# Patient Record
Sex: Female | Born: 1960
Health system: Southern US, Community
[De-identification: ages and names within clinical notes are randomized; demographics above are authoritative.]

## PROBLEM LIST (undated history)

## (undated) DIAGNOSIS — C4359 Malignant melanoma of other part of trunk: Secondary | ICD-10-CM

## (undated) DIAGNOSIS — G459 Transient cerebral ischemic attack, unspecified: Secondary | ICD-10-CM

## (undated) DIAGNOSIS — L03115 Cellulitis of right lower limb: Secondary | ICD-10-CM

## (undated) DIAGNOSIS — J45909 Unspecified asthma, uncomplicated: Secondary | ICD-10-CM

## (undated) DIAGNOSIS — Z8719 Personal history of other diseases of the digestive system: Secondary | ICD-10-CM

## (undated) DIAGNOSIS — C539 Malignant neoplasm of cervix uteri, unspecified: Secondary | ICD-10-CM

## (undated) DIAGNOSIS — J449 Chronic obstructive pulmonary disease, unspecified: Secondary | ICD-10-CM

## (undated) HISTORY — PX: GYNECOLOGIC CRYOSURGERY: SHX857

---

## 1988-04-30 HISTORY — PX: TUBAL LIGATION: SHX77

## 1997-12-27 ENCOUNTER — Emergency Department (HOSPITAL_COMMUNITY): Admission: EM | Admit: 1997-12-27 | Discharge: 1997-12-27 | Payer: Self-pay | Admitting: Emergency Medicine

## 2002-04-29 ENCOUNTER — Emergency Department (HOSPITAL_COMMUNITY): Admission: EM | Admit: 2002-04-29 | Discharge: 2002-04-29 | Payer: Self-pay | Admitting: Emergency Medicine

## 2003-05-22 ENCOUNTER — Emergency Department (HOSPITAL_COMMUNITY): Admission: EM | Admit: 2003-05-22 | Discharge: 2003-05-22 | Payer: Self-pay | Admitting: Emergency Medicine

## 2003-09-01 ENCOUNTER — Emergency Department (HOSPITAL_COMMUNITY): Admission: EM | Admit: 2003-09-01 | Discharge: 2003-09-01 | Payer: Self-pay | Admitting: Emergency Medicine

## 2005-06-25 ENCOUNTER — Emergency Department (HOSPITAL_COMMUNITY): Admission: EM | Admit: 2005-06-25 | Discharge: 2005-06-25 | Payer: Self-pay | Admitting: Emergency Medicine

## 2005-06-28 ENCOUNTER — Emergency Department (HOSPITAL_COMMUNITY): Admission: EM | Admit: 2005-06-28 | Discharge: 2005-06-28 | Payer: Self-pay | Admitting: Emergency Medicine

## 2006-09-29 ENCOUNTER — Emergency Department (HOSPITAL_COMMUNITY): Admission: EM | Admit: 2006-09-29 | Discharge: 2006-09-29 | Payer: Self-pay | Admitting: Emergency Medicine

## 2007-02-18 ENCOUNTER — Emergency Department (HOSPITAL_COMMUNITY): Admission: EM | Admit: 2007-02-18 | Discharge: 2007-02-18 | Payer: Self-pay | Admitting: Emergency Medicine

## 2008-08-31 ENCOUNTER — Emergency Department (HOSPITAL_BASED_OUTPATIENT_CLINIC_OR_DEPARTMENT_OTHER): Admission: EM | Admit: 2008-08-31 | Discharge: 2008-08-31 | Payer: Self-pay | Admitting: Emergency Medicine

## 2009-04-30 HISTORY — PX: MELANOMA EXCISION: SHX5266

## 2009-07-02 ENCOUNTER — Emergency Department (HOSPITAL_COMMUNITY): Admission: EM | Admit: 2009-07-02 | Discharge: 2009-07-02 | Payer: Self-pay | Admitting: Emergency Medicine

## 2010-07-31 ENCOUNTER — Emergency Department (HOSPITAL_BASED_OUTPATIENT_CLINIC_OR_DEPARTMENT_OTHER)
Admission: EM | Admit: 2010-07-31 | Discharge: 2010-07-31 | Disposition: A | Discharge status: Home / Self Care | Payer: Self-pay | Attending: Emergency Medicine | Admitting: Emergency Medicine

## 2010-07-31 DIAGNOSIS — N12 Tubulo-interstitial nephritis, not specified as acute or chronic: Secondary | ICD-10-CM | POA: Insufficient documentation

## 2010-07-31 DIAGNOSIS — J45909 Unspecified asthma, uncomplicated: Secondary | ICD-10-CM | POA: Insufficient documentation

## 2010-07-31 DIAGNOSIS — M549 Dorsalgia, unspecified: Secondary | ICD-10-CM | POA: Insufficient documentation

## 2010-07-31 DIAGNOSIS — F17200 Nicotine dependence, unspecified, uncomplicated: Secondary | ICD-10-CM | POA: Insufficient documentation

## 2010-07-31 LAB — URINALYSIS, ROUTINE W REFLEX MICROSCOPIC
Nitrite: NEGATIVE
Specific Gravity, Urine: 1.018 (ref 1.005–1.030)
Urobilinogen, UA: 1 mg/dL (ref 0.0–1.0)

## 2010-07-31 LAB — URINE MICROSCOPIC-ADD ON

## 2010-12-06 ENCOUNTER — Encounter: Payer: Self-pay | Admitting: *Deleted

## 2010-12-06 ENCOUNTER — Emergency Department (HOSPITAL_BASED_OUTPATIENT_CLINIC_OR_DEPARTMENT_OTHER)
Admission: EM | Admit: 2010-12-06 | Discharge: 2010-12-06 | Disposition: A | Discharge status: Home / Self Care | Payer: Self-pay | Attending: Emergency Medicine | Admitting: Emergency Medicine

## 2010-12-06 DIAGNOSIS — L02419 Cutaneous abscess of limb, unspecified: Secondary | ICD-10-CM

## 2010-12-06 DIAGNOSIS — IMO0002 Reserved for concepts with insufficient information to code with codable children: Secondary | ICD-10-CM | POA: Insufficient documentation

## 2010-12-06 MED ORDER — TETANUS-DIPHTH-ACELL PERTUSSIS 5-2.5-18.5 LF-MCG/0.5 IM SUSP
0.5000 mL | Freq: Once | INTRAMUSCULAR | Status: AC
  Administered 2010-12-06: 0.5 mL via INTRAMUSCULAR
  Filled 2010-12-06: qty 0.5

## 2010-12-06 MED ORDER — DOXYCYCLINE HYCLATE 100 MG PO CAPS: 100.0000 mg | ORAL_CAPSULE | Freq: Two times a day (BID) | ORAL | Status: AC

## 2010-12-06 NOTE — ED Provider Notes (Signed)
History     CSN: [id number] Arrival date & time: 12/06/2010  3:42 PM  Chief Complaint  Patient presents with  . Abscess   HPI Comments: Pt states that it is getting a little better, but it is not going away  Patient is a 49 y.o. female presenting with abscess. The history is provided by the patient. No language interpreter was used.  Abscess  This is a new problem. The current episode started less than one week ago. The problem has been gradually improving. Affected Location: left elbow. The problem is mild. The abscess is characterized by redness and painfulness. The abscess first occurred at home. Pertinent negatives include no fever, no diarrhea, no vomiting, no sore throat and no cough. Her past medical history is significant for skin abscesses in family. There were sick contacts at home. She has received no recent medical care.    History reviewed. No pertinent past medical history.  Past Surgical History  Procedure Date  . Tubal ligation     History reviewed. No pertinent family history.  History  Substance Use Topics  . Smoking status: Current Everyday Smoker -- 0.5 packs/day  . Smokeless tobacco: Not on file  . Alcohol Use: No    OB History    Grav Para Term Preterm Abortions TAB SAB Ect Mult Living                  Review of Systems  Constitutional: Negative for fever.  HENT: Negative for sore throat.   Respiratory: Negative for cough.   Gastrointestinal: Negative for vomiting and diarrhea.  All other systems reviewed and are negative.    Physical Exam  BP 147/89  Pulse 85  Temp(Src) 98.8 F (37.1 C) (Oral)  Resp 16  Wt 150 lb (68.04 kg)  Physical Exam  Vitals reviewed. Constitutional: She is oriented to person, place, and time. She appears well-developed and well-nourished.  Cardiovascular: Normal rate and regular rhythm.   Musculoskeletal: Normal range of motion. She exhibits tenderness.  Neurological: She is alert and oriented to person, place,  and time.  Skin:       Pt has red tender area to the left elbow:no fluctuance or firmness noted:pt has a scab to the center of the area  Psychiatric: She has a normal mood and affect.    ED Course  Procedures  MDM No need for I&D at this time;will treat with antibiotics:location not consistent with a  bursitis      Teressa Lower, NP 12/06/10 1554  Teressa Lower, NP 12/06/10 1555

## 2010-12-06 NOTE — ED Notes (Signed)
Pt c/o abscess to left elbow x 3 days

## 2010-12-31 NOTE — ED Provider Notes (Signed)
Medical screening examination/treatment/procedure(s) were performed by non-physician practitioner and as supervising physician I was immediately available for consultation/collaboration.   Shelda Jakes, MD 12/31/10 [id number]

## 2011-04-30 ENCOUNTER — Encounter (HOSPITAL_BASED_OUTPATIENT_CLINIC_OR_DEPARTMENT_OTHER): Payer: Self-pay | Admitting: Emergency Medicine

## 2011-04-30 ENCOUNTER — Emergency Department (HOSPITAL_BASED_OUTPATIENT_CLINIC_OR_DEPARTMENT_OTHER)
Admission: EM | Admit: 2011-04-30 | Discharge: 2011-04-30 | Disposition: A | Discharge status: Home / Self Care | Payer: Self-pay | Attending: Emergency Medicine | Admitting: Emergency Medicine

## 2011-04-30 ENCOUNTER — Emergency Department (INDEPENDENT_AMBULATORY_CARE_PROVIDER_SITE_OTHER): Payer: Self-pay

## 2011-04-30 DIAGNOSIS — R05 Cough: Secondary | ICD-10-CM | POA: Insufficient documentation

## 2011-04-30 DIAGNOSIS — R1032 Left lower quadrant pain: Secondary | ICD-10-CM

## 2011-04-30 DIAGNOSIS — R112 Nausea with vomiting, unspecified: Secondary | ICD-10-CM

## 2011-04-30 DIAGNOSIS — F17200 Nicotine dependence, unspecified, uncomplicated: Secondary | ICD-10-CM | POA: Insufficient documentation

## 2011-04-30 DIAGNOSIS — R059 Cough, unspecified: Secondary | ICD-10-CM | POA: Insufficient documentation

## 2011-04-30 DIAGNOSIS — D259 Leiomyoma of uterus, unspecified: Secondary | ICD-10-CM

## 2011-04-30 LAB — CBC
MCV: 89.8 fL (ref 78.0–100.0)
Platelets: 172 10*3/uL (ref 150–400)
RBC: 5.19 MIL/uL — ABNORMAL HIGH (ref 3.87–5.11)
WBC: 5.4 10*3/uL (ref 4.0–10.5)

## 2011-04-30 LAB — URINALYSIS, ROUTINE W REFLEX MICROSCOPIC
Glucose, UA: NEGATIVE mg/dL
Ketones, ur: 15 mg/dL — AB
Leukocytes, UA: NEGATIVE
pH: 5.5 (ref 5.0–8.0)

## 2011-04-30 LAB — COMPREHENSIVE METABOLIC PANEL
ALT: 15 U/L (ref 0–35)
Alkaline Phosphatase: 67 U/L (ref 39–117)
CO2: 20 mEq/L (ref 19–32)
Chloride: 103 mEq/L (ref 96–112)
GFR calc Af Amer: 68 mL/min — ABNORMAL LOW (ref >90)
GFR calc non Af Amer: 58 mL/min — ABNORMAL LOW (ref >90)
Glucose, Bld: 87 mg/dL (ref 70–99)
Potassium: 3.5 mEq/L (ref 3.5–5.1)
Sodium: 137 mEq/L (ref 135–145)
Total Bilirubin: 0.4 mg/dL (ref 0.3–1.2)

## 2011-04-30 LAB — DIFFERENTIAL
Eosinophils Relative: 0 % (ref 0–5)
Lymphocytes Relative: 30 % (ref 12–46)
Lymphs Abs: 1.6 10*3/uL (ref 0.7–4.0)
Neutro Abs: 2.9 10*3/uL (ref 1.7–7.7)

## 2011-04-30 MED ORDER — PROMETHAZINE HCL 25 MG PO TABS: 25.0000 mg | ORAL_TABLET | Freq: Four times a day (QID) | ORAL | Status: AC | PRN

## 2011-04-30 MED ORDER — IOHEXOL 300 MG/ML  SOLN
18.0000 mL | INTRAMUSCULAR | Status: DC | PRN
  Administered 2011-04-30: 18 mL via ORAL

## 2011-04-30 MED ORDER — HYDROMORPHONE HCL PF 1 MG/ML IJ SOLN
1.0000 mg | Freq: Once | INTRAMUSCULAR | Status: AC
  Administered 2011-04-30: 1 mg via INTRAVENOUS
  Filled 2011-04-30: qty 1

## 2011-04-30 MED ORDER — IOHEXOL 300 MG/ML  SOLN
100.0000 mL | Freq: Once | INTRAMUSCULAR | Status: AC | PRN
  Administered 2011-04-30: 100 mL via INTRAVENOUS

## 2011-04-30 MED ORDER — SODIUM CHLORIDE 0.9 % IV SOLN
Freq: Once | INTRAVENOUS | Status: AC
  Administered 2011-04-30: 19:00:00 via INTRAVENOUS

## 2011-04-30 MED ORDER — ONDANSETRON HCL 4 MG/2ML IJ SOLN
4.0000 mg | Freq: Once | INTRAMUSCULAR | Status: AC
Start: 2011-04-30 — End: 2011-04-30
  Administered 2011-04-30: 4 mg via INTRAVENOUS
  Filled 2011-04-30: qty 2

## 2011-04-30 MED ORDER — HYDROCODONE-ACETAMINOPHEN 5-325 MG PO TABS: 2.0000 | ORAL_TABLET | ORAL | Status: AC | PRN

## 2011-04-30 NOTE — ED Notes (Signed)
Pt reports L flank pain with radiation to L LQ

## 2011-04-30 NOTE — ED Provider Notes (Signed)
History     CSN: [id number]  Arrival date & time 04/30/11  1652   None     Chief Complaint  Patient presents with  . Abdominal Pain    (Consider location/radiation/quality/duration/timing/severity/associated sxs/prior treatment) Patient is a 50 y.o. female presenting with abdominal pain. The history is provided by the patient. No language interpreter was used.  Abdominal Pain The primary symptoms of the illness include abdominal pain. The current episode started more than 2 days ago. The onset of the illness was gradual. The problem has been gradually worsening.  Associated with: coughing. The patient states that she believes she is currently not pregnant. The patient has not had a change in bowel habit. Additional symptoms associated with the illness include back pain. Significant associated medical issues do not include PUD or diverticulitis.    Past Medical History  Diagnosis Date  . Melanoma   . Hernia     Past Surgical History  Procedure Date  . Tubal ligation     No family history on file.  History  Substance Use Topics  . Smoking status: Current Everyday Smoker -- 0.5 packs/day  . Smokeless tobacco: Not on file  . Alcohol Use: No    OB History    Grav Para Term Preterm Abortions TAB SAB Ect Mult Living                  Review of Systems  Gastrointestinal: Positive for abdominal pain.  Musculoskeletal: Positive for back pain.  All other systems reviewed and are negative.    Allergies  Asa buff (mag; Chocolate; and Compazine  Home Medications   Current Outpatient Rx  Name Route Sig Dispense Refill  . IBUPROFEN 200 MG PO TABS Oral Take 800 mg by mouth every 6 (six) hours as needed. For pain     . PSEUDOEPH-DOXYLAMINE-DM-APAP 30-6.25-15-325 MG PO CAPS Oral Take 2 capsules by mouth at bedtime.        BP 116/80  Pulse 68  Temp(Src) 97.8 F (36.6 C) (Oral)  Resp 20  SpO2 99%  Physical Exam  Nursing note and vitals reviewed. Constitutional:  She is oriented to person, place, and time. She appears well-developed and well-nourished.  HENT:  Head: Normocephalic and atraumatic.  Right Ear: External ear normal.  Left Ear: External ear normal.  Nose: Nose normal.  Mouth/Throat: Oropharynx is clear and moist.  Eyes: Conjunctivae and EOM are normal. Pupils are equal, round, and reactive to light.  Neck: Normal range of motion. Neck supple.  Cardiovascular: Normal rate and normal heart sounds.   Pulmonary/Chest: Effort normal and breath sounds normal.  Abdominal: Soft. There is tenderness. There is guarding.  Musculoskeletal: Normal range of motion.  Neurological: She is alert and oriented to person, place, and time.  Skin: Skin is warm.  Psychiatric: She has a normal mood and affect.    ED Course  Procedures (including critical care time)   Labs Reviewed  URINALYSIS, ROUTINE W REFLEX MICROSCOPIC   No results found.   No diagnosis found.    MDM   Results for orders placed during the hospital encounter of 04/30/11  URINALYSIS, ROUTINE W REFLEX MICROSCOPIC      Component Value Range   Color, Urine AMBER (*) YELLOW    APPearance CLOUDY (*) CLEAR    Specific Gravity, Urine 1.027  1.005 - 1.030    pH 5.5  5.0 - 8.0    Glucose, UA NEGATIVE  NEGATIVE (mg/dL)   Hgb urine dipstick NEGATIVE  NEGATIVE  Bilirubin Urine SMALL (*) NEGATIVE    Ketones, ur 15 (*) NEGATIVE (mg/dL)   Protein, ur 30 (*) NEGATIVE (mg/dL)   Urobilinogen, UA 1.0  0.0 - 1.0 (mg/dL)   Nitrite NEGATIVE  NEGATIVE    Leukocytes, UA NEGATIVE  NEGATIVE   URINE MICROSCOPIC-ADD ON      Component Value Range   Squamous Epithelial / LPF FEW (*) RARE    Bacteria, UA RARE  RARE   CBC      Component Value Range   WBC 5.4  4.0 - 10.5 (K/uL)   RBC 5.19 (*) 3.87 - 5.11 (MIL/uL)   Hemoglobin 16.4 (*) 12.0 - 15.0 (g/dL)   HCT 16.1 (*) 09.6 - 46.0 (%)   MCV 89.8  78.0 - 100.0 (fL)   MCH 31.6  26.0 - 34.0 (pg)   MCHC 35.2  30.0 - 36.0 (g/dL)   RDW 04.5  40.9  - 81.1 (%)   Platelets 172  150 - 400 (K/uL)  DIFFERENTIAL      Component Value Range   Neutrophils Relative 54  43 - 77 (%)   Neutro Abs 2.9  1.7 - 7.7 (K/uL)   Lymphocytes Relative 30  12 - 46 (%)   Lymphs Abs 1.6  0.7 - 4.0 (K/uL)   Monocytes Relative 16 (*) 3 - 12 (%)   Monocytes Absolute 0.8  0.1 - 1.0 (K/uL)   Eosinophils Relative 0  0 - 5 (%)   Eosinophils Absolute 0.0  0.0 - 0.7 (K/uL)   Basophils Relative 0  0 - 1 (%)   Basophils Absolute 0.0  0.0 - 0.1 (K/uL)  COMPREHENSIVE METABOLIC PANEL      Component Value Range   Sodium 137  135 - 145 (mEq/L)   Potassium 3.5  3.5 - 5.1 (mEq/L)   Chloride 103  96 - 112 (mEq/L)   CO2 20  19 - 32 (mEq/L)   Glucose, Bld 87  70 - 99 (mg/dL)   BUN 18  6 - 23 (mg/dL)   Creatinine, Ser 9.14  0.50 - 1.10 (mg/dL)   Calcium 8.8  8.4 - 78.2 (mg/dL)   Total Protein 7.0  6.0 - 8.3 (g/dL)   Albumin 3.5  3.5 - 5.2 (g/dL)   AST 23  0 - 37 (U/L)   ALT 15  0 - 35 (U/L)   Alkaline Phosphatase 67  39 - 117 (U/L)   Total Bilirubin 0.4  0.3 - 1.2 (mg/dL)   GFR calc non Af Amer 58 (*) >90 (mL/min)   GFR calc Af Amer 68 (*) >90 (mL/min)  LIPASE, BLOOD      Component Value Range   Lipase 28  11 - 59 (U/L)   Ct Abdomen Pelvis W Contrast  04/30/2011  *RADIOLOGY REPORT*  Clinical Data: Left lower quadrant pain.  Nausea and vomiting.  CT ABDOMEN AND PELVIS WITH CONTRAST  Technique:  Multidetector CT imaging of the abdomen and pelvis was performed following the standard protocol during bolus administration of intravenous contrast.  Contrast: 18mL OMNIPAQUE IOHEXOL 300 MG/ML IV SOLN, OMNIPAQUE IOHEXOL 300 MG/ML IV SOLN  Comparison: None.  Findings: The liver, spleen, pancreas, adrenal glands, and kidneys are normal.  Gallbladder and bile ducts are normal. The bowel appears normal including the terminal ileum and appendix.  There is no diverticulosis or diverticulitis.  There are at least two fibroids in the uterus the largest being to the left of midline  measuring 3.1 cm in diameter.  Ovaries are not identified.  No acute osseous abnormality.  Mild arthritic changes at both hips.  IMPRESSION: No acute abnormality of the abdomen or pelvis.  Original Report Authenticated By: Gwynn Burly, M.D.   Pt given Iv fluids, nausea and pain medication.   Ct scan is normal.  I will treat cough and pain.  I will given Vicodin that should help with cough and with pain.  Pt given rx for phenergan        Langston Masker, [place] 04/30/11 2039

## 2011-04-30 NOTE — ED Notes (Signed)
Pt urinated on herself on stretcher. EMT at bedside assisting pt with clean linens and clean gown. Pt sts she has "problems controlling her bladder when she coughs".

## 2011-04-30 NOTE — ED Notes (Signed)
PA at bedside.

## 2011-04-30 NOTE — ED Notes (Signed)
Patient is resting comfortably.returned from CT scan no distress noted

## 2011-04-30 NOTE — ED Notes (Signed)
Pt c/o abd pain (RUQ & RLQ) x 3 days; sts she thinks "it has something to do w/ my coughing"

## 2011-05-01 NOTE — ED Provider Notes (Signed)
History/physical exam/procedure(s) were performed by non-physician practitioner and as supervising physician I was immediately available for consultation/collaboration. I have reviewed all notes and am in agreement with care and plan.   Emilyanne Mcgough S Imari Reen, MD 05/01/11 0720 

## 2011-11-06 ENCOUNTER — Emergency Department (HOSPITAL_BASED_OUTPATIENT_CLINIC_OR_DEPARTMENT_OTHER)
Admission: EM | Admit: 2011-11-06 | Discharge: 2011-11-06 | Disposition: A | Discharge status: Home / Self Care | Payer: Self-pay | Attending: Emergency Medicine | Admitting: Emergency Medicine

## 2011-11-06 ENCOUNTER — Encounter (HOSPITAL_BASED_OUTPATIENT_CLINIC_OR_DEPARTMENT_OTHER): Payer: Self-pay | Admitting: Emergency Medicine

## 2011-11-06 DIAGNOSIS — S0500XA Injury of conjunctiva and corneal abrasion without foreign body, unspecified eye, initial encounter: Secondary | ICD-10-CM

## 2011-11-06 DIAGNOSIS — Z91018 Allergy to other foods: Secondary | ICD-10-CM | POA: Insufficient documentation

## 2011-11-06 DIAGNOSIS — Z888 Allergy status to other drugs, medicaments and biological substances status: Secondary | ICD-10-CM | POA: Insufficient documentation

## 2011-11-06 DIAGNOSIS — F17200 Nicotine dependence, unspecified, uncomplicated: Secondary | ICD-10-CM | POA: Insufficient documentation

## 2011-11-06 DIAGNOSIS — S058X9A Other injuries of unspecified eye and orbit, initial encounter: Secondary | ICD-10-CM | POA: Insufficient documentation

## 2011-11-06 DIAGNOSIS — Z85820 Personal history of malignant melanoma of skin: Secondary | ICD-10-CM | POA: Insufficient documentation

## 2011-11-06 DIAGNOSIS — IMO0002 Reserved for concepts with insufficient information to code with codable children: Secondary | ICD-10-CM | POA: Insufficient documentation

## 2011-11-06 DIAGNOSIS — K469 Unspecified abdominal hernia without obstruction or gangrene: Secondary | ICD-10-CM | POA: Insufficient documentation

## 2011-11-06 MED ORDER — FLUORESCEIN SODIUM 1 MG OP STRP
ORAL_STRIP | OPHTHALMIC | Status: AC
  Administered 2011-11-06: 19:00:00
  Filled 2011-11-06: qty 1

## 2011-11-06 MED ORDER — ERYTHROMYCIN 5 MG/GM OP OINT
TOPICAL_OINTMENT | Freq: Four times a day (QID) | OPHTHALMIC | Status: DC
  Administered 2011-11-06: 19:00:00 via OPHTHALMIC
  Filled 2011-11-06: qty 3.5

## 2011-11-06 MED ORDER — TETRACAINE HCL 0.5 % OP SOLN
OPHTHALMIC | Status: AC
  Administered 2011-11-06: 19:00:00
  Filled 2011-11-06: qty 2

## 2011-11-06 MED ORDER — HYDROCODONE-ACETAMINOPHEN 5-500 MG PO TABS: 1.0000 | ORAL_TABLET | Freq: Four times a day (QID) | ORAL | Status: AC | PRN
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
## 2011-11-06 NOTE — ED Provider Notes (Signed)
Medical screening examination/treatment/procedure(s) were performed by non-physician practitioner and as supervising physician I was immediately available for consultation/collaboration.   Ahlana Slaydon B. Bernette Mayers, MD 11/06/11 2029

## 2012-01-05 ENCOUNTER — Emergency Department (HOSPITAL_BASED_OUTPATIENT_CLINIC_OR_DEPARTMENT_OTHER)
Admission: EM | Admit: 2012-01-05 | Discharge: 2012-01-05 | Disposition: A | Discharge status: Home / Self Care | Payer: Self-pay | Attending: Emergency Medicine | Admitting: Emergency Medicine

## 2012-01-05 ENCOUNTER — Encounter (HOSPITAL_BASED_OUTPATIENT_CLINIC_OR_DEPARTMENT_OTHER): Payer: Self-pay | Admitting: Emergency Medicine

## 2012-01-05 DIAGNOSIS — L02219 Cutaneous abscess of trunk, unspecified: Secondary | ICD-10-CM | POA: Insufficient documentation

## 2012-01-05 DIAGNOSIS — Z888 Allergy status to other drugs, medicaments and biological substances status: Secondary | ICD-10-CM | POA: Insufficient documentation

## 2012-01-05 DIAGNOSIS — F17200 Nicotine dependence, unspecified, uncomplicated: Secondary | ICD-10-CM | POA: Insufficient documentation

## 2012-01-05 DIAGNOSIS — L0291 Cutaneous abscess, unspecified: Secondary | ICD-10-CM

## 2012-01-05 DIAGNOSIS — L0390 Cellulitis, unspecified: Secondary | ICD-10-CM

## 2012-01-05 MED ORDER — CEPHALEXIN 500 MG PO CAPS: 500.0000 mg | ORAL_CAPSULE | Freq: Four times a day (QID) | ORAL | Status: AC

## 2012-01-05 MED ORDER — SULFAMETHOXAZOLE-TRIMETHOPRIM 800-160 MG PO TABS: 1.0000 | ORAL_TABLET | Freq: Two times a day (BID) | ORAL | Status: AC

## 2012-01-05 MED ORDER — LIDOCAINE HCL 2 % IJ SOLN
20.0000 mL | Freq: Once | INTRAMUSCULAR | Status: AC
  Administered 2012-01-05: 400 mg via INTRADERMAL

## 2012-01-05 MED ORDER — LIDOCAINE HCL 2 % IJ SOLN
INTRAMUSCULAR | Status: AC
  Administered 2012-01-05: 400 mg via INTRADERMAL
  Filled 2012-01-05: qty 1

## 2012-01-05 MED ORDER — HYDROCODONE-ACETAMINOPHEN 5-325 MG PO TABS: 2.0000 | ORAL_TABLET | ORAL | Status: AC | PRN

## 2012-01-05 NOTE — ED Notes (Signed)
Pt states upon showering she noticed an abscess on her left lower back, which was causing her to itch. Pt also reports a smaller area in the axilla of the right arm. Pt is concerned this may have resulted from a spider bite. Area tender to touch. Pt reports taking tylenol with no relief.

## 2012-01-05 NOTE — ED Provider Notes (Signed)
History     CSN: [id number]  Arrival date & time 01/05/12  1246   First MD Initiated Contact with Patient 01/05/12 1255      Chief Complaint  Patient presents with  . Abscess    (Consider location/radiation/quality/duration/timing/severity/associated sxs/prior treatment) Patient is a 51 y.o. female presenting with abscess. The history is provided by the patient and medical records.  Abscess  Pertinent negatives include no fever and no vomiting.    Breanna Sullivan is a 51 y.o. female presents to the emergency department complaining of abscess to the lower left back.  The onset of the symptoms was  gradual starting 3 days ago.  The patient has associated chills, pain at the site.  The symptoms have been  persistent, gradually worsened.  Nothing makes the symptoms worse and nothing makes symptoms better.  The patient denies fever, headache, nausea, vomiting, diarrhea, chest pain, shortness of breath.  Patient states she noticed the site about 3 days ago. She is into to squeeze it and was unable to express any pus. She states is continued to get a urine more painful. She also states that she has a smaller site under her right axilla however it is nonpainful, nonerythematous.  She states no history of abscess in the past. No history of MRSA infection. No history of diabetes or accommodating factors.   Past Medical History  Diagnosis Date  . Melanoma   . Hernia     Past Surgical History  Procedure Date  . Tubal ligation   . Melanoma excision 2011    No family history on file.  History  Substance Use Topics  . Smoking status: Current Everyday Smoker -- 0.5 packs/day  . Smokeless tobacco: Not on file  . Alcohol Use: No    OB History    Grav Para Term Preterm Abortions TAB SAB Ect Mult Living                  Review of Systems  Constitutional: Positive for chills. Negative for fever.  Respiratory: Negative for shortness of breath.   Cardiovascular: Negative for chest pain.    Gastrointestinal: Negative for nausea and vomiting.  Genitourinary: Negative for dysuria.  Skin: Positive for rash.  Neurological: Negative for headaches.    Allergies  Asa buff (mag; Chocolate; and Compazine  Home Medications   Current Outpatient Rx  Name Route Sig Dispense Refill  . ACETAMINOPHEN 325 MG PO TABS Oral Take 650 mg by mouth every 6 (six) hours as needed.    . CEPHALEXIN 500 MG PO CAPS Oral Take 1 capsule (500 mg total) by mouth 4 (four) times daily. 40 capsule 0  . DM-GUAIFENESIN ER 30-600 MG PO TB12 Oral Take 1 tablet by mouth every 12 (twelve) hours. Patient used this medication for congestion.    [name] HYDROCODONE-ACETAMINOPHEN 5-325 MG PO TABS Oral Take 2 tablets by mouth every 4 (four) hours as needed for pain. 6 tablet 0  . IBUPROFEN 200 MG PO TABS Oral Take 800 mg by mouth every 6 (six) hours as needed. Patient used this medication for pain.    . SULFAMETHOXAZOLE-TRIMETHOPRIM 800-160 MG PO TABS Oral Take 1 tablet by mouth every 12 (twelve) hours. 20 tablet 0    BP 158/84  Pulse 97  Temp 98 F (36.7 C) (Oral)  Resp 16  Ht 5\' 3"  (1.6 m)  Wt 165 lb (74.844 kg)  BMI 29.23 kg/m2  SpO2 98%  Physical Exam  Nursing note and vitals reviewed. Constitutional: She appears  well-developed and well-nourished. No distress.  HENT:  Head: Normocephalic and atraumatic.  Eyes: Conjunctivae are normal. No scleral icterus.  Cardiovascular: Normal rate, regular rhythm and normal heart sounds.  Exam reveals no gallop and no friction rub.   No murmur heard. Pulmonary/Chest: Effort normal and breath sounds normal. No respiratory distress.  Neurological: She is alert.  Skin: Skin is warm and dry. Rash:  abscess LL back. She is not diaphoretic. There is erythema.          Small area of swelling and tenderness to the R axilla - nonerythematous, nondraining, without induration or evidence of cellulitis    ED Course  Procedures (including critical care time)  Labs Reviewed - No  data to display No results found.  INCISION AND DRAINAGE Performed by: Dierdre Forth Consent: Verbal consent obtained. Risks and benefits: risks, benefits and alternatives were discussed Type: abscess  Body area: LL back  Anesthesia: local infiltration  Local anesthetic: lidocaine 1% without epinephrine  Anesthetic total: 4 ml  Complexity: complex Blunt dissection to break up loculations  Drainage: purulent  Drainage amount: 3cc  Packing material: 1/4 in iodoform gauze  Patient tolerance: Patient tolerated the procedure well with no immediate complications.    1. Abscess   2. Cellulitis       MDM  Rhiley Tarver presents with abscess and cellulitis to the lower back. Patient with skin abscess amenable to incision and drainage.  I&D of the lower back abscess was performed without complications.  Abscess was large enough to warrant packing with removal and wound recheck in 2 days.  I believe with antibiotics the right axilla abscess will resolve on its own. I've instructed the patient with warm compresses for both abscesses.  Antibiotic therapy with Bactrim and Keflex is indicated secondary to the cellulitis and right axilla abscess.  I have also discussed reasons to return immediately to the ER.  Patient expresses understanding and agrees with plan.  1. Medications: Bactrim, Keflex, Norco 2. Treatment: Take medication as prescribed, do not drive or operate machinery with pain medication, use warm compresses on the axilla and lower back. 3. Follow Up: Here in the emergency department for wound check and packing removal on Monday.  Followup with your doctor or an urgent care in order to remove your packing in 48-72 hours. You may return to the emergency department if you have  a fever that persists greater than 101 or your abscess appears to become infected (growing surrounding redness and warmth). Do not operate any heavy machinery while on pain medications. Do not  consume alcohol on these medications either.  Abscess An abscess (boil or furuncle) is an infected area that contains a collection of pus.  SYMPTOMS Signs and symptoms of an abscess include pain, tenderness, redness, or hardness. You may feel a moveable soft area under your skin. An abscess can occur anywhere in the body.  TREATMENT  A surgical cut (incision) may be made over your abscess to drain the pus. Gauze may be packed into the space or a drain may be looped through the abscess cavity (pocket). This provides a drain that will allow the cavity to heal from the inside outwards. The abscess may be painful for a few days, but should feel much better if it was drained.  Your abscess, if seen early, may not have localized and may not have been drained. If not, another appointment may be required if it does not get better on its own or with medications. HOME CARE  INSTRUCTIONS   Only take over-the-counter or prescription medicines for pain, discomfort, or fever as directed by your caregiver.   Take your antibiotics as directed if they were prescribed. Finish them even if you start to feel better.   Keep the skin and clothes clean around your abscess.   If the abscess was drained, you will need to use gauze dressing to collect any draining pus. Dressings will typically need to be changed 3 or more times a day.   The infection may spread by skin contact with others. Avoid skin contact as much as possible.   Practice good hygiene. This includes regular hand washing, cover any draining skin lesions, and do not share personal care items.   If you participate in sports, do not share athletic equipment, towels, whirlpools, or personal care items. Shower after every practice or tournament.   If a draining area cannot be adequately covered:   Do not participate in sports.   Children should not participate in day care until the wound has healed or drainage stops.   If your caregiver has given you  a follow-up appointment, it is very important to keep that appointment. Not keeping the appointment could result in a much worse infection, chronic or permanent injury, pain, and disability. If there is any problem keeping the appointment, you must call back to this facility for assistance.  SEEK MEDICAL CARE IF:   You develop increased pain, swelling, redness, drainage, or bleeding in the wound site.   You develop signs of generalized infection including muscle aches, chills, fever, or a general ill feeling.   You have an oral temperature above 102 F (38.9 C).  MAKE SURE YOU:   Understand these instructions.   Will watch your condition.   Will get help right away if you are not doing well or get worse.  Document Released: 01/24/2005 Document Revised: 12/27/2010 Document Reviewed: 11/18/2007 [facility] Patient Information 2012 [place], [place].            Dahlia Client Sae Handrich, PA-C 01/05/12 1426

## 2012-01-06 NOTE — ED Provider Notes (Signed)
Medical screening examination/treatment/procedure(s) were performed by non-physician practitioner and as supervising physician I was immediately available for consultation/collaboration.  Cyndra Numbers, MD 01/06/12 [phone]

## 2012-10-26 ENCOUNTER — Emergency Department (HOSPITAL_BASED_OUTPATIENT_CLINIC_OR_DEPARTMENT_OTHER)
Admission: EM | Admit: 2012-10-26 | Discharge: 2012-10-26 | Disposition: A | Discharge status: Home / Self Care | Payer: Self-pay | Attending: Emergency Medicine | Admitting: Emergency Medicine

## 2012-10-26 ENCOUNTER — Encounter (HOSPITAL_BASED_OUTPATIENT_CLINIC_OR_DEPARTMENT_OTHER): Payer: Self-pay

## 2012-10-26 DIAGNOSIS — F17200 Nicotine dependence, unspecified, uncomplicated: Secondary | ICD-10-CM | POA: Insufficient documentation

## 2012-10-26 DIAGNOSIS — L03113 Cellulitis of right upper limb: Secondary | ICD-10-CM

## 2012-10-26 DIAGNOSIS — Z85820 Personal history of malignant melanoma of skin: Secondary | ICD-10-CM | POA: Insufficient documentation

## 2012-10-26 DIAGNOSIS — Z8719 Personal history of other diseases of the digestive system: Secondary | ICD-10-CM | POA: Insufficient documentation

## 2012-10-26 DIAGNOSIS — IMO0002 Reserved for concepts with insufficient information to code with codable children: Secondary | ICD-10-CM | POA: Insufficient documentation

## 2012-10-26 DIAGNOSIS — M7020 Olecranon bursitis, unspecified elbow: Secondary | ICD-10-CM | POA: Insufficient documentation

## 2012-10-26 DIAGNOSIS — M7021 Olecranon bursitis, right elbow: Secondary | ICD-10-CM

## 2012-10-26 MED ORDER — CLINDAMYCIN HCL 150 MG PO CAPS: 300.0000 mg | ORAL_CAPSULE | Freq: Three times a day (TID) | ORAL | Status: DC

## 2012-10-26 MED ORDER — OXYCODONE-ACETAMINOPHEN 5-325 MG PO TABS
ORAL_TABLET | ORAL | Status: AC
  Filled 2012-10-26: qty 1

## 2012-10-26 MED ORDER — CLINDAMYCIN HCL 150 MG PO CAPS
300.0000 mg | ORAL_CAPSULE | Freq: Once | ORAL | Status: AC
  Administered 2012-10-26: 300 mg via ORAL

## 2012-10-26 MED ORDER — OXYCODONE-ACETAMINOPHEN 5-325 MG PO TABS
1.0000 | ORAL_TABLET | Freq: Once | ORAL | Status: AC
  Administered 2012-10-26: 1 via ORAL
  Filled 2012-10-26: qty 1

## 2012-10-26 MED ORDER — CLINDAMYCIN HCL 150 MG PO CAPS
ORAL_CAPSULE | ORAL | Status: AC
  Filled 2012-10-26: qty 2

## 2012-10-26 MED ORDER — OXYCODONE-ACETAMINOPHEN 5-325 MG PO TABS: 1.0000 | ORAL_TABLET | ORAL | Status: DC | PRN

## 2012-10-26 NOTE — ED Provider Notes (Signed)
   History    CSN: [id number] Arrival date & time 10/26/12  1818  First MD Initiated Contact with Patient 10/26/12 1918     Chief Complaint  Patient presents with  . elbow swelling    (Consider location/radiation/quality/duration/timing/severity/associated sxs/prior Treatment) HPI Breanna Sullivan is a 52 y.o. female who presents to ED with complaint right elbow swelling. States she was working in the yard where she thinks something "poked" her. States noticed a pustule to the elbow that since has been getting larger. States she haas poked it at home herself with no drainage. State she has been putting bacitracin topically. States it is worsening. Denies painwith flexion and extension of the elbow joint.  Past Medical History  Diagnosis Date  . Melanoma   . Hernia    Past Surgical History  Procedure Laterality Date  . Tubal ligation    . Melanoma excision  2011   No family history on file. History  Substance Use Topics  . Smoking status: Current Every Day Smoker -- 0.50 packs/day  . Smokeless tobacco: Not on file  . Alcohol Use: No   OB History   Grav Para Term Preterm Abortions TAB SAB Ect Mult Living                 Review of Systems  Constitutional: Negative for fever and chills.  Musculoskeletal: Positive for joint swelling.  Skin: Positive for color change and wound.  Neurological: Negative for weakness and numbness.    Allergies  Asa buff (mag; Chocolate; and Compazine  Home Medications  No current outpatient prescriptions on file. BP 130/70  Pulse 93  Temp(Src) 99.1 F (37.3 C)  Resp 18  SpO2 100%  Physical Exam  Nursing note and vitals reviewed. Constitutional: She appears well-developed and well-nourished. No distress.  HENT:  Head: Normocephalic.  Eyes: Conjunctivae are normal.  Cardiovascular: Normal rate, regular rhythm and normal heart sounds.   Pulmonary/Chest: Effort normal and breath sounds normal. No respiratory distress. She has no wheezes.  She has no rales.  Musculoskeletal:  Pustule to the right elbow with surrounding erythema and swelling. Erythema approximately 6cm in diameter. Swelling of the right olecranon bursa. Tender. Full rom of the elbow joint with no pain.   Neurological: She is alert.  Skin: Skin is warm and dry.    ED Course  Procedures (including critical care time) Labs Reviewed - No data to display No results found.  Elbow cleaned with iodine. Using 18guage needle pustule aspirated. Mild amount of purulent drainage. No deeper palpable abscess .  1. Cellulitis of right arm   2. Olecranon bursitis of right elbow     MDM  Pt with purulent pustule and surrounding cellulitis. Also inflammation and tenderness over olecranon bursa with only mild swelling. Suspect cellulitis due to the skin infection with possible now septic olecranon bursitis. Pt afebrile, otherwise non toxic appearing. ACE wrap applied to the arm. Home with pain medication and clindamcyin. Follow up with orthopedics.   Filed Vitals:   10/26/12 1901 10/26/12 1923  BP: 130/70   Pulse: 93   Temp: 99.1 F (37.3 C)   Resp: 18   SpO2: 100% 100%     Myriam Jacobson Evvie Behrmann, PA-C 10/26/12 2341

## 2012-10-26 NOTE — ED Notes (Signed)
Left elbow pain reddened and edematous  And painful to touch pain increases with movement. PA at bedside ace wrap ordered after aspiration of small lesion Left elbow. Drsg and ace wrap to be applied.

## 2012-10-26 NOTE — ED Notes (Signed)
Patient here with right elbow swelling and redness after possible insect bite x 3 days. Reports that she dried to drain without any success, warm to touch

## 2012-10-27 NOTE — ED Provider Notes (Signed)
Medical screening examination/treatment/procedure(s) were performed by non-physician practitioner and as supervising physician I was immediately available for consultation/collaboration.   Rolan Bucco, MD 10/27/12 0005

## 2014-01-16 ENCOUNTER — Encounter (HOSPITAL_COMMUNITY): Payer: Self-pay | Admitting: Emergency Medicine

## 2014-01-16 ENCOUNTER — Emergency Department (HOSPITAL_COMMUNITY)
Admission: EM | Admit: 2014-01-16 | Discharge: 2014-01-16 | Disposition: A | Discharge status: Home / Self Care | Payer: Self-pay | Attending: Emergency Medicine | Admitting: Emergency Medicine

## 2014-01-16 ENCOUNTER — Emergency Department (HOSPITAL_COMMUNITY): Payer: Self-pay

## 2014-01-16 DIAGNOSIS — S4980XA Other specified injuries of shoulder and upper arm, unspecified arm, initial encounter: Secondary | ICD-10-CM | POA: Insufficient documentation

## 2014-01-16 DIAGNOSIS — Z8719 Personal history of other diseases of the digestive system: Secondary | ICD-10-CM | POA: Insufficient documentation

## 2014-01-16 DIAGNOSIS — F17200 Nicotine dependence, unspecified, uncomplicated: Secondary | ICD-10-CM | POA: Insufficient documentation

## 2014-01-16 DIAGNOSIS — Z792 Long term (current) use of antibiotics: Secondary | ICD-10-CM | POA: Insufficient documentation

## 2014-01-16 DIAGNOSIS — S6990XA Unspecified injury of unspecified wrist, hand and finger(s), initial encounter: Secondary | ICD-10-CM | POA: Insufficient documentation

## 2014-01-16 DIAGNOSIS — M25511 Pain in right shoulder: Secondary | ICD-10-CM

## 2014-01-16 DIAGNOSIS — IMO0002 Reserved for concepts with insufficient information to code with codable children: Secondary | ICD-10-CM | POA: Insufficient documentation

## 2014-01-16 DIAGNOSIS — Z85820 Personal history of malignant melanoma of skin: Secondary | ICD-10-CM | POA: Insufficient documentation

## 2014-01-16 DIAGNOSIS — S46909A Unspecified injury of unspecified muscle, fascia and tendon at shoulder and upper arm level, unspecified arm, initial encounter: Secondary | ICD-10-CM | POA: Insufficient documentation

## 2014-01-16 DIAGNOSIS — S6980XA Other specified injuries of unspecified wrist, hand and finger(s), initial encounter: Secondary | ICD-10-CM | POA: Insufficient documentation

## 2014-01-16 DIAGNOSIS — S60411A Abrasion of left index finger, initial encounter: Secondary | ICD-10-CM

## 2014-01-16 MED ORDER — HYDROCODONE-ACETAMINOPHEN 5-325 MG PO TABS
1.0000 | ORAL_TABLET | Freq: Once | ORAL | Status: AC
Start: 2014-01-16 — End: 2014-01-16
  Administered 2014-01-16: 1 via ORAL
  Filled 2014-01-16: qty 1
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
## 2014-01-16 NOTE — ED Notes (Signed)
Patient transported to X-ray 
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
## 2014-01-18 ENCOUNTER — Telehealth (HOSPITAL_COMMUNITY): Payer: Self-pay | Admitting: *Deleted

## 2014-11-12 ENCOUNTER — Encounter (HOSPITAL_BASED_OUTPATIENT_CLINIC_OR_DEPARTMENT_OTHER): Payer: Self-pay | Admitting: *Deleted

## 2014-11-12 ENCOUNTER — Emergency Department (HOSPITAL_BASED_OUTPATIENT_CLINIC_OR_DEPARTMENT_OTHER)
Admission: EM | Admit: 2014-11-12 | Discharge: 2014-11-12 | Disposition: A | Discharge status: Home / Self Care | Payer: Self-pay | Attending: Emergency Medicine | Admitting: Emergency Medicine

## 2014-11-12 DIAGNOSIS — L0291 Cutaneous abscess, unspecified: Secondary | ICD-10-CM

## 2014-11-12 DIAGNOSIS — L02415 Cutaneous abscess of right lower limb: Secondary | ICD-10-CM | POA: Insufficient documentation

## 2014-11-12 DIAGNOSIS — L03115 Cellulitis of right lower limb: Secondary | ICD-10-CM | POA: Insufficient documentation

## 2014-11-12 DIAGNOSIS — Z85820 Personal history of malignant melanoma of skin: Secondary | ICD-10-CM | POA: Insufficient documentation

## 2014-11-12 DIAGNOSIS — Z720 Tobacco use: Secondary | ICD-10-CM | POA: Insufficient documentation

## 2014-11-12 DIAGNOSIS — Z8719 Personal history of other diseases of the digestive system: Secondary | ICD-10-CM | POA: Insufficient documentation

## 2014-11-12 LAB — BASIC METABOLIC PANEL
ANION GAP: 7 (ref 5–15)
BUN: 15 mg/dL (ref 6–20)
CALCIUM: 9.1 mg/dL (ref 8.9–10.3)
CHLORIDE: 110 mmol/L (ref 101–111)
CO2: 22 mmol/L (ref 22–32)
Creatinine, Ser: 1.19 mg/dL — ABNORMAL HIGH (ref 0.44–1.00)
GFR calc Af Amer: >60 mL/min (ref >60)
GFR, EST NON AFRICAN AMERICAN: 52 mL/min — AB (ref >60)
GLUCOSE: 112 mg/dL — AB (ref 65–99)
Potassium: 4.3 mmol/L (ref 3.5–5.1)
SODIUM: 139 mmol/L (ref 135–145)

## 2014-11-12 LAB — CBC WITH DIFFERENTIAL/PLATELET
Basophils Absolute: 0 10*3/uL (ref 0.0–0.1)
Basophils Relative: 0 % (ref 0–1)
EOS ABS: 0.1 10*3/uL (ref 0.0–0.7)
Eosinophils Relative: 1 % (ref 0–5)
HCT: 49.3 % — ABNORMAL HIGH (ref 36.0–46.0)
HEMOGLOBIN: 17.1 g/dL — AB (ref 12.0–15.0)
LYMPHS PCT: 24 % (ref 12–46)
Lymphs Abs: 1.8 10*3/uL (ref 0.7–4.0)
MCH: 32.8 pg (ref 26.0–34.0)
MCHC: 34.7 g/dL (ref 30.0–36.0)
MCV: 94.4 fL (ref 78.0–100.0)
Monocytes Absolute: 0.7 10*3/uL (ref 0.1–1.0)
Monocytes Relative: 10 % (ref 3–12)
NEUTROS ABS: 4.9 10*3/uL (ref 1.7–7.7)
Neutrophils Relative %: 65 % (ref 43–77)
PLATELETS: 240 10*3/uL (ref 150–400)
RBC: 5.22 MIL/uL — ABNORMAL HIGH (ref 3.87–5.11)
RDW: 12.5 % (ref 11.5–15.5)
WBC: 7.5 10*3/uL (ref 4.0–10.5)

## 2014-11-12 LAB — I-STAT CG4 LACTIC ACID, ED: LACTIC ACID, VENOUS: 1.23 mmol/L (ref 0.5–2.0)

## 2014-11-12 MED ORDER — ALBUTEROL SULFATE (2.5 MG/3ML) 0.083% IN NEBU
INHALATION_SOLUTION | RESPIRATORY_TRACT | Status: AC
  Administered 2014-11-12: 2.5 mg
  Filled 2014-11-12: qty 3

## 2014-11-12 MED ORDER — SODIUM CHLORIDE 0.9 % IV BOLUS (SEPSIS)
1000.0000 mL | Freq: Once | INTRAVENOUS | Status: AC
  Administered 2014-11-12: 1000 mL via INTRAVENOUS

## 2014-11-12 MED ORDER — MORPHINE SULFATE 4 MG/ML IJ SOLN
4.0000 mg | Freq: Once | INTRAMUSCULAR | Status: AC
  Administered 2014-11-12: 4 mg via INTRAVENOUS
  Filled 2014-11-12: qty 1

## 2014-11-12 MED ORDER — ONDANSETRON HCL 4 MG/2ML IJ SOLN
4.0000 mg | Freq: Once | INTRAMUSCULAR | Status: AC
  Administered 2014-11-12: 4 mg via INTRAVENOUS
  Filled 2014-11-12: qty 2

## 2014-11-12 MED ORDER — HYDROCODONE-ACETAMINOPHEN 5-325 MG PO TABS
2.0000 | ORAL_TABLET | Freq: Once | ORAL | Status: AC
  Administered 2014-11-12: 2 via ORAL
  Filled 2014-11-12: qty 2

## 2014-11-12 MED ORDER — IPRATROPIUM-ALBUTEROL 0.5-2.5 (3) MG/3ML IN SOLN
RESPIRATORY_TRACT | Status: AC
  Administered 2014-11-12: 3 mL
  Filled 2014-11-12: qty 3

## 2014-11-12 MED ORDER — ONDANSETRON HCL 4 MG PO TABS: 4.0000 mg | ORAL_TABLET | Freq: Four times a day (QID) | ORAL | Status: DC

## 2014-11-12 MED ORDER — HYDROCODONE-ACETAMINOPHEN 5-325 MG PO TABS: 2.0000 | ORAL_TABLET | ORAL | Status: DC | PRN

## 2014-11-12 MED ORDER — LIDOCAINE-EPINEPHRINE (PF) 2 %-1:200000 IJ SOLN
10.0000 mL | Freq: Once | INTRAMUSCULAR | Status: AC
  Administered 2014-11-12: 10 mL
  Filled 2014-11-12: qty 10

## 2014-11-12 MED ORDER — CLINDAMYCIN HCL 150 MG PO CAPS: 300.0000 mg | ORAL_CAPSULE | Freq: Four times a day (QID) | ORAL | Status: DC

## 2014-11-12 MED ORDER — CLINDAMYCIN PHOSPHATE 600 MG/50ML IV SOLN
600.0000 mg | Freq: Once | INTRAVENOUS | Status: AC
  Administered 2014-11-12: 600 mg via INTRAVENOUS
  Filled 2014-11-12: qty 50
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
## 2014-11-12 NOTE — ED Provider Notes (Signed)
CSN: [id number]     Arrival date & time 11/12/14  1123 History   First MD Initiated Contact with Patient 11/12/14 1212     Chief Complaint  Patient presents with  . Insect Bite     (Consider location/radiation/quality/duration/timing/severity/associated sxs/prior Treatment) Patient is a 54 y.o. female presenting with leg pain.  Leg Pain Location:  Leg Time since incident:  3 days Injury: yes   Mechanism of injury comment:  Possible bite from spider Leg location:  R lower leg Pain details:    Quality:  Throbbing and sharp   Radiates to:  Does not radiate   Severity:  Severe   Duration:  3 days   Timing:  Constant   Progression:  Worsening Chronicity:  New Dislocation: no   Foreign body present:  No foreign bodies Prior injury to area:  No Relieved by:  Nothing Worsened by:  Bearing weight Ineffective treatments:  None tried Associated symptoms: fever (subjective) and swelling   Associated symptoms: no back pain, no decreased ROM, no fatigue, no itching, no muscle weakness, no neck pain and no numbness     Past Medical History  Diagnosis Date  . Melanoma   . Hernia    Past Surgical History  Procedure Laterality Date  . Tubal ligation    . Melanoma excision  2011   No family history on file. History  Substance Use Topics  . Smoking status: Current Every Day Smoker -- 0.50 packs/day  . Smokeless tobacco: Not on file  . Alcohol Use: No   OB History    No data available     Review of Systems  Constitutional: Positive for fever (subjective). Negative for fatigue.  HENT: Negative for sore throat.   Eyes: Negative for visual disturbance.  Respiratory: Negative for cough and shortness of breath.   Cardiovascular: Negative for chest pain.  Gastrointestinal: Negative for abdominal pain.  Genitourinary: Negative for difficulty urinating.  Musculoskeletal: Negative for back pain and neck pain.  Skin: Positive for rash and wound. Negative for itching.  Neurological:  Negative for syncope and headaches.      Allergies  Asa buff (mag; Chocolate; and Compazine  Home Medications   Prior to Admission medications   Medication Sig Start Date End Date Taking? Authorizing Provider  acetaminophen (TYLENOL) 500 MG tablet Take 1,000 mg by mouth every 6 (six) hours as needed (FOR PAINL).    Historical Provider, MD  clindamycin (CLEOCIN) 150 MG capsule Take 2 capsules (300 mg total) by mouth every 6 (six) hours. 11/12/14 11/22/14  Gareth Morgan, MD  HYDROcodone-acetaminophen (NORCO/VICODIN) 5-325 MG per tablet Take 2 tablets by mouth every 4 (four) hours as needed. 11/12/14   Gareth Morgan, MD  ondansetron (ZOFRAN) 4 MG tablet Take 1 tablet (4 mg total) by mouth every 6 (six) hours. 11/12/14   Gareth Morgan, MD   BP 104/67 mmHg  Pulse 80  Temp(Src) 97.6 F (36.4 C) (Oral)  Resp 18  Ht 5' 2.5" (1.588 m)  Wt 165 lb (74.844 kg)  BMI 29.68 kg/m2  SpO2 93% Physical Exam  Constitutional: She is oriented to person, place, and time. She appears well-developed and well-nourished. No distress.  HENT:  Head: Normocephalic and atraumatic.  Eyes: Conjunctivae and EOM are normal.  Neck: Normal range of motion.  Cardiovascular: Normal rate, regular rhythm, normal heart sounds and intact distal pulses.  Exam reveals no gallop and no friction rub.   No murmur heard. Pulmonary/Chest: Effort normal and breath sounds normal. No respiratory distress.  She has no wheezes. She has no rales.  Abdominal: Soft. She exhibits no distension. There is no tenderness. There is no guarding.  Musculoskeletal: She exhibits no edema or tenderness.  Neurological: She is alert and oriented to person, place, and time.  Skin: Skin is warm and dry. Rash (7cm area of erythema, black punctate scab) noted. She is not diaphoretic. No erythema.  Nursing note and vitals reviewed.   ED Course  INCISION AND DRAINAGE Date/Time: 11/13/2014 2:06 AM Performed by: Gareth Morgan Authorized by:  Gareth Morgan Consent: Verbal consent obtained. Risks and benefits: risks, benefits and alternatives were discussed Patient understanding: patient states understanding of the procedure being performed Patient consent: the patient's understanding of the procedure matches consent given Procedure consent: procedure consent matches procedure scheduled Relevant documents: relevant documents present and verified Test results: test results available and properly labeled Site marked: the operative site was marked Imaging studies: imaging studies available Required items: required blood products, implants, devices, and special equipment available Patient identity confirmed: verbally with patient Time out: Immediately prior to procedure a "time out" was called to verify the correct patient, procedure, equipment, support staff and site/side marked as required. Type: abscess Body area: lower extremity Location details: right leg Anesthesia: local infiltration Local anesthetic: lidocaine 2% with epinephrine Anesthetic total: 5 ml Patient sedated: no Scalpel size: 11 Incision type: single straight Complexity: simple Drainage: purulent and  bloody Drainage amount: moderate Wound treatment: wound left open Packing material: 1/4 in iodoform gauze Patient tolerance: Patient tolerated the procedure well with no immediate complications   (including critical care time) Labs Review Labs Reviewed  BASIC METABOLIC PANEL - Abnormal; Notable for the following:    Glucose, Bld 112 (*)    Creatinine, Ser 1.19 (*)    GFR calc non Af Amer 52 (*)    All other components within normal limits  CBC WITH DIFFERENTIAL/PLATELET - Abnormal; Notable for the following:    RBC 5.22 (*)    Hemoglobin 17.1 (*)    HCT 49.3 (*)    All other components within normal limits  CULTURE, BLOOD (ROUTINE X 2)  CULTURE, BLOOD (ROUTINE X 2)  I-STAT CG4 LACTIC ACID, ED    Imaging Review No results found.   EKG  Interpretation None      MDM   Final diagnoses:  Cellulitis of right lower extremity  Abscess   54yo female with history of melanoma presents with concern for painful, red lesion on leg, nausea, vomiting and subjective fevers.  Lesion not classic for black widow spider bite.  Exam consistent with abscess and cellulitis.  No hx to suggest foreign body. No signs to suggest necrotizing fasciitis. Given nausea on arrival, pt given bolus of NS and IV clindamycin prior to performing I/D. Doubt other intraabdominal causes of nausea.  Prior to performing I/D pt sleeping with 3 readings of low bp at which point lactic acid and blood cx added for possible sepsis with need for admission.  However, shortly after this recheck BP showed normotension and pt observed in ED with continuing normal bp.  Lactic acid normal, no leukocytosis, pt afebrile in the ED, and no signs of sepsis.  I/D performed with small amt of purulence and bleeding and packed with iodoform.  Pt remains stable and is appropriate for trial of outpt abx for cellulitis.  Given rx for clindamycin, zofran, norco.  Recommend return to ED in 2 days for wound recheck. Patient discharged in stable condition with understanding of reasons to return.  Gareth Morgan, MD 11/13/14 [phone]

## 2014-11-12 NOTE — Discharge Instructions (Signed)
Abscess °An abscess (boil or furuncle) is an infected area on or under the skin. This area is filled with yellowish-white fluid (pus) and other material (debris). °HOME CARE  °· Only take medicines as told by your doctor. °· If you were given antibiotic medicine, take it as directed. Finish the medicine even if you start to feel better. °· If gauze is used, follow your doctor's directions for changing the gauze. °· To avoid spreading the infection: °¨ Keep your abscess covered with a bandage. °¨ Wash your hands well. °¨ Do not share personal care items, towels, or whirlpools with others. °¨ Avoid skin contact with others. °· Keep your skin and clothes clean around the abscess. °· Keep all doctor visits as told. °GET HELP RIGHT AWAY IF:  °· You have more pain, puffiness (swelling), or redness in the wound site. °· You have more fluid or blood coming from the wound site. °· You have muscle aches, chills, or you feel sick. °· You have a fever. °MAKE SURE YOU:  °· Understand these instructions. °· Will watch your condition. °· Will get help right away if you are not doing well or get worse. °Document Released: 10/03/2007 Document Revised: 10/16/2011 Document Reviewed: 06/29/2011 °ExitCare® Patient Information ©2015 [employer]. This information is not intended to replace advice given to you by your health care provider. Make sure you discuss any questions you have with your health care provider. ° °

## 2014-11-12 NOTE — Care Management (Signed)
ED CM received call from patient at [facility] in [place].  regarding not being able to afford discharge prescriptions.  Patient was seen at [facility] ED, patient discharged on clindamycin and pain medication. ED CM contacted [facility]  concerning the cost of the medication.  Discussed MATCH program and the guidelines, including the $3 co-pay per prescription.  Patient is eligible and is agreeable to the assistance. Patient enrolled in program. [facility] award letter printed and faxed to pharmacy.336 [phone]. No further ED CM needs identified.[name]

## 2014-11-15 ENCOUNTER — Inpatient Hospital Stay (HOSPITAL_BASED_OUTPATIENT_CLINIC_OR_DEPARTMENT_OTHER)
Admission: EM | Admit: 2014-11-15 | Discharge: 2014-11-18 | DRG: 603 | Disposition: A | Discharge status: Home / Self Care | Payer: Self-pay | Attending: Internal Medicine | Admitting: Internal Medicine

## 2014-11-15 ENCOUNTER — Emergency Department (HOSPITAL_BASED_OUTPATIENT_CLINIC_OR_DEPARTMENT_OTHER): Payer: Self-pay

## 2014-11-15 ENCOUNTER — Encounter (HOSPITAL_BASED_OUTPATIENT_CLINIC_OR_DEPARTMENT_OTHER): Payer: Self-pay | Admitting: Emergency Medicine

## 2014-11-15 DIAGNOSIS — L03115 Cellulitis of right lower limb: Principal | ICD-10-CM | POA: Diagnosis present

## 2014-11-15 DIAGNOSIS — Z23 Encounter for immunization: Secondary | ICD-10-CM

## 2014-11-15 DIAGNOSIS — N179 Acute kidney failure, unspecified: Secondary | ICD-10-CM | POA: Diagnosis present

## 2014-11-15 DIAGNOSIS — Z888 Allergy status to other drugs, medicaments and biological substances status: Secondary | ICD-10-CM

## 2014-11-15 DIAGNOSIS — T63301A Toxic effect of unspecified spider venom, accidental (unintentional), initial encounter: Secondary | ICD-10-CM | POA: Diagnosis present

## 2014-11-15 DIAGNOSIS — Z91018 Allergy to other foods: Secondary | ICD-10-CM

## 2014-11-15 DIAGNOSIS — Z9114 Patient's other noncompliance with medication regimen: Secondary | ICD-10-CM | POA: Diagnosis present

## 2014-11-15 DIAGNOSIS — Z886 Allergy status to analgesic agent status: Secondary | ICD-10-CM

## 2014-11-15 DIAGNOSIS — F17210 Nicotine dependence, cigarettes, uncomplicated: Secondary | ICD-10-CM | POA: Diagnosis present

## 2014-11-15 DIAGNOSIS — Z79899 Other long term (current) drug therapy: Secondary | ICD-10-CM

## 2014-11-15 DIAGNOSIS — Z1611 Resistance to penicillins: Secondary | ICD-10-CM | POA: Diagnosis present

## 2014-11-15 HISTORY — DX: Unspecified asthma, uncomplicated: J45.909

## 2014-11-15 HISTORY — DX: Transient cerebral ischemic attack, unspecified: G45.9

## 2014-11-15 HISTORY — DX: Malignant melanoma of other part of trunk: C43.59

## 2014-11-15 HISTORY — DX: Malignant neoplasm of cervix uteri, unspecified: C53.9

## 2014-11-15 HISTORY — DX: Cellulitis of right lower limb: L03.115

## 2014-11-15 HISTORY — DX: Personal history of other diseases of the digestive system: Z87.19

## 2014-11-15 LAB — BASIC METABOLIC PANEL
Anion gap: 13 (ref 5–15)
BUN: 20 mg/dL (ref 6–20)
CO2: 22 mmol/L (ref 22–32)
Calcium: 9.3 mg/dL (ref 8.9–10.3)
Chloride: 107 mmol/L (ref 101–111)
Creatinine, Ser: 1.29 mg/dL — ABNORMAL HIGH (ref 0.44–1.00)
GFR calc non Af Amer: 47 mL/min — ABNORMAL LOW (ref >60)
GFR, EST AFRICAN AMERICAN: 55 mL/min — AB (ref >60)
Glucose, Bld: 179 mg/dL — ABNORMAL HIGH (ref 65–99)
Potassium: 3.8 mmol/L (ref 3.5–5.1)
SODIUM: 142 mmol/L (ref 135–145)

## 2014-11-15 LAB — CBC
HCT: 49.9 % — ABNORMAL HIGH (ref 36.0–46.0)
HEMOGLOBIN: 17.4 g/dL — AB (ref 12.0–15.0)
MCH: 33 pg (ref 26.0–34.0)
MCHC: 34.9 g/dL (ref 30.0–36.0)
MCV: 94.7 fL (ref 78.0–100.0)
PLATELETS: 227 10*3/uL (ref 150–400)
RBC: 5.27 MIL/uL — ABNORMAL HIGH (ref 3.87–5.11)
RDW: 12.7 % (ref 11.5–15.5)
WBC: 6.7 10*3/uL (ref 4.0–10.5)

## 2014-11-15 MED ORDER — VANCOMYCIN HCL IN DEXTROSE 1-5 GM/200ML-% IV SOLN
1000.0000 mg | Freq: Once | INTRAVENOUS | Status: AC
  Administered 2014-11-15: 1000 mg via INTRAVENOUS
  Filled 2014-11-15: qty 200

## 2014-11-15 MED ORDER — SODIUM CHLORIDE 0.9 % IV SOLN
INTRAVENOUS | Status: AC
  Administered 2014-11-15: 18:00:00 via INTRAVENOUS

## 2014-11-15 MED ORDER — SODIUM CHLORIDE 0.9 % IV BOLUS (SEPSIS)
1000.0000 mL | Freq: Once | INTRAVENOUS | Status: AC
  Administered 2014-11-15: 1000 mL via INTRAVENOUS

## 2014-11-15 MED ORDER — ZOLPIDEM TARTRATE 5 MG PO TABS: 5.0000 mg | ORAL_TABLET | Freq: Every evening | ORAL | Status: DC | PRN

## 2014-11-15 MED ORDER — PNEUMOCOCCAL VAC POLYVALENT 25 MCG/0.5ML IJ INJ
0.5000 mL | INJECTION | INTRAMUSCULAR | Status: AC
  Administered 2014-11-17: 0.5 mL via INTRAMUSCULAR

## 2014-11-15 MED ORDER — ONDANSETRON HCL 4 MG PO TABS: 4.0000 mg | ORAL_TABLET | Freq: Four times a day (QID) | ORAL | Status: DC | PRN

## 2014-11-15 MED ORDER — SENNOSIDES-DOCUSATE SODIUM 8.6-50 MG PO TABS: 1.0000 | ORAL_TABLET | Freq: Every evening | ORAL | Status: DC | PRN

## 2014-11-15 MED ORDER — MORPHINE SULFATE 4 MG/ML IJ SOLN
4.0000 mg | Freq: Once | INTRAMUSCULAR | Status: AC
  Administered 2014-11-15: 4 mg via INTRAVENOUS
  Filled 2014-11-15: qty 1

## 2014-11-15 MED ORDER — ONDANSETRON HCL 4 MG/2ML IJ SOLN
4.0000 mg | Freq: Four times a day (QID) | INTRAMUSCULAR | Status: DC | PRN
  Administered 2014-11-16 (×2): 4 mg via INTRAVENOUS
  Filled 2014-11-15 (×2): qty 2

## 2014-11-15 MED ORDER — ENOXAPARIN SODIUM 40 MG/0.4ML ~~LOC~~ SOLN
40.0000 mg | SUBCUTANEOUS | Status: DC
  Administered 2014-11-15 – 2014-11-17 (×3): 40 mg via SUBCUTANEOUS
  Filled 2014-11-15 (×3): qty 0.4

## 2014-11-15 MED ORDER — ACETAMINOPHEN 650 MG RE SUPP: 650.0000 mg | Freq: Four times a day (QID) | RECTAL | Status: DC | PRN

## 2014-11-15 MED ORDER — CLINDAMYCIN PHOSPHATE 600 MG/50ML IV SOLN
600.0000 mg | Freq: Three times a day (TID) | INTRAVENOUS | Status: DC
  Administered 2014-11-15 – 2014-11-16 (×3): 600 mg via INTRAVENOUS
  Filled 2014-11-15 (×4): qty 50

## 2014-11-15 MED ORDER — ACETAMINOPHEN 325 MG PO TABS: 650.0000 mg | ORAL_TABLET | Freq: Four times a day (QID) | ORAL | Status: DC | PRN

## 2014-11-15 MED ORDER — OXYCODONE HCL 5 MG PO TABS
5.0000 mg | ORAL_TABLET | ORAL | Status: DC | PRN
  Administered 2014-11-15 – 2014-11-18 (×8): 5 mg via ORAL
  Filled 2014-11-15 (×8): qty 1
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
## 2014-11-15 NOTE — ED Provider Notes (Signed)
CSN: [id number]     Arrival date & time 11/15/14  1023 History   First MD Initiated Contact with Patient 11/15/14 1024     Chief Complaint  Patient presents with  . Insect Bite     (Consider location/radiation/quality/duration/timing/severity/associated sxs/prior Treatment) HPI Comments: 54 year old female presenting with worsening cellulitis after being seen in the ED 3 days ago with a possible spider bite. At that time, she had an I&D, dose of IV clindamycin, and discharged home on PO clindamycin. Patient states the area has gotten more red, spreading beyond the markings that were drawn, and is very painful, especially when she walks or touches it. Pain described as throbbing and sharp, 10/10. States it feels like the infection has hit her bone. Denies fevers.  The history is provided by the patient.    Past Medical History  Diagnosis Date  . Melanoma   . Hernia    Past Surgical History  Procedure Laterality Date  . Tubal ligation    . Melanoma excision  2011   History reviewed. No pertinent family history. History  Substance Use Topics  . Smoking status: Current Every Day Smoker -- 0.50 packs/day  . Smokeless tobacco: Not on file  . Alcohol Use: No   OB History    No data available     Review of Systems  Musculoskeletal:       + R lower leg pain.  Skin: Positive for color change and wound.  All other systems reviewed and are negative.     Allergies  Asa buff (mag; Chocolate; and Compazine  Home Medications   Prior to Admission medications   Medication Sig Start Date End Date Taking? Authorizing Provider  acetaminophen (TYLENOL) 500 MG tablet Take 1,000 mg by mouth every 6 (six) hours as needed (FOR PAINL).    Historical Provider, MD  clindamycin (CLEOCIN) 150 MG capsule Take 2 capsules (300 mg total) by mouth every 6 (six) hours. 11/12/14 11/22/14  Gareth Morgan, MD  HYDROcodone-acetaminophen (NORCO/VICODIN) 5-325 MG per tablet Take 2 tablets by mouth every 4  (four) hours as needed. 11/12/14   Gareth Morgan, MD  ondansetron (ZOFRAN) 4 MG tablet Take 1 tablet (4 mg total) by mouth every 6 (six) hours. 11/12/14   Gareth Morgan, MD   BP 116/77 mmHg  Pulse 94  Temp(Src) 98.6 F (37 C) (Oral)  Resp 22  Ht 5\' 2"  (1.575 m)  Wt 165 lb (74.844 kg)  BMI 30.17 kg/m2  SpO2 97% Physical Exam  Constitutional: She is oriented to person, place, and time. She appears well-developed and well-nourished. No distress.  HENT:  Head: Normocephalic and atraumatic.  Mouth/Throat: Oropharynx is clear and moist.  Eyes: Conjunctivae and EOM are normal.  Neck: Normal range of motion. Neck supple.  Cardiovascular: Normal rate, regular rhythm and normal heart sounds.   Pulmonary/Chest: Effort normal and breath sounds normal. No respiratory distress.  Musculoskeletal: Normal range of motion. She exhibits no edema.  Neurological: She is alert and oriented to person, place, and time. No sensory deficit.  Skin: Skin is warm and dry.  10 cm area of erythema on R anterior lower leg that is extending about 4 cm beyond skin markings drawn. Central area of moist, black purulence. Very tender. Pain increased with R ankle flexion.  Psychiatric: She has a normal mood and affect. Her behavior is normal.  Nursing note and vitals reviewed.   ED Course  Procedures (including critical care time) Labs Review Labs Reviewed  CBC - Abnormal; Notable  for the following:    RBC 5.27 (*)    Hemoglobin 17.4 (*)    HCT 49.9 (*)    All other components within normal limits  BASIC METABOLIC PANEL - Abnormal; Notable for the following:    Glucose, Bld 179 (*)    Creatinine, Ser 1.29 (*)    GFR calc non Af Amer 47 (*)    GFR calc Af Amer 55 (*)    All other components within normal limits  CULTURE, BLOOD (ROUTINE X 2)  CULTURE, BLOOD (ROUTINE X 2)    Imaging Review Dg Tibia/fibula Right  11/15/2014   CLINICAL DATA:  Bite by a spider 1 week ago with swelling, redness, and lesion on  the distal fibula. Initial encounter.  EXAM: RIGHT TIBIA AND FIBULA - 2 VIEW  COMPARISON:  None.  FINDINGS: There is no evidence of fracture or other focal bone lesions. Soft tissue thickening over the lower shin with superimposed chronic venous type calcifications. Incidental plantar calcaneal spur.  IMPRESSION: Soft tissue swelling without soft tissue gas or osseous abnormality.   Electronically Signed   By: Monte Fantasia M.D.   On: 11/15/2014 11:23     EKG Interpretation None      MDM   Final diagnoses:  Cellulitis of right lower leg   Uncomfortable but in no apparent distress. Nontoxic-appearing. AF VSS. Cellulitis spread beyond skin markings drawn. Failed outpatient treatment. Labs without any evidence of leukocytosis. X-ray without any evidence of bony involvement. Given that patient failed outpatient treatment, will admit for monitoring and IV antibiotics. Vancomycin started in the ED. Admission accepted by Dr. Grandville Silos, [facility]. Stable for transfer.  Discussed with attending Dr. Aline Brochure who also evaluated patient and agrees with plan of care.  Carman Ching, PA-C 11/15/14 [place], MD 11/15/14 [phone]

## 2014-11-15 NOTE — ED Notes (Signed)
Pt states she got seen for spider bite 3 days ago and it's not getting any better

## 2014-11-15 NOTE — Progress Notes (Signed)
Pt admitted to 6N11 from ED at [facility].  Pt AAO X 4.  Pt on RA.  Pt has 18G to Rt AC SL.  Pt has cellulitis to RLE, red, swollen, and painful to touch.  Notified MD to make them aware pt on the floor.  Pt has no request at the moment.  Will continue to monitor.

## 2014-11-15 NOTE — H&P (Addendum)
History and Physical  Breanna Sullivan TDS:[id number] DOB: 10/31/1960 DOA: 11/15/2014  Referring physician: Lucien Mons, PA-C, ED provider PCP: No PCP Per Patient   Chief Complaint: Leg cellulitis.  HPI: Breanna Sullivan is a 54 y.o. female  With a history of melanoma who presented to the emergency department 3 days ago after 3 days of worsening redness, pain, and swelling in the right lower extremity. She was found to have an abscess which was incised and drained with minimal  purulence. The patient was prescribed clindamycin, although she could not afford the medication and never filled it. The cellulitis worsened over the past 3 days the patient presented to the emergency department at [facility]  due to increasing pain. Pain is in the right lower leg that is worsened with standing, weightbearing, pressure. Improved with rest. No further drainage.    Review of Systems:   Pt complains worsening redness  Pt denies any fevers, chills, nausea, vomiting, diarrhea, constipation, abdominal pain, just shortness of breath, palpitations, headaches, blurred vision, diarrhea, constipation, melana, rectal bleeding.  Review of systems are otherwise negative  Past Medical History  Diagnosis Date  . Melanoma   . Hernia    Past Surgical History  Procedure Laterality Date  . Tubal ligation    . Melanoma excision  2011   Social History:  reports that she has been smoking.  She does not have any smokeless tobacco history on file. She reports that she does not drink alcohol or use illicit drugs. Patient lives at  home & is able to participate in activities of daily living  Allergies  Allergen Reactions  . Asa Buff (Mag [Buffered Aspirin] Nausea Only  . Chocolate Nausea And Vomiting  . Compazine Hives    family history of hypertension    Prior to Admission medications   Medication Sig Start Date End Date Taking? Authorizing Provider  acetaminophen (TYLENOL) 500 MG tablet Take 1,000 mg by mouth  every 6 (six) hours as needed (FOR PAINL).    Historical Provider, MD  clindamycin (CLEOCIN) 150 MG capsule Take 2 capsules (300 mg total) by mouth every 6 (six) hours. 11/12/14 11/22/14  Gareth Morgan, MD  HYDROcodone-acetaminophen (NORCO/VICODIN) 5-325 MG per tablet Take 2 tablets by mouth every 4 (four) hours as needed. 11/12/14   Gareth Morgan, MD  ondansetron (ZOFRAN) 4 MG tablet Take 1 tablet (4 mg total) by mouth every 6 (six) hours. 11/12/14   Gareth Morgan, MD    Physical Exam: BP 113/71 mmHg  Pulse 70  Temp(Src) 97.6 F (36.4 C) (Oral)  Resp 18  Ht 5\' 2"  (1.575 m)  Wt 74.844 kg (165 lb)  BMI 30.17 kg/m2  SpO2 98%  General: middle-aged Caucasian female. Awake and alert and oriented x3. No acute cardiopulmonary distress.  Eyes: Pupils equal, round, reactive to light. Extraocular muscles are intact. Sclerae anicteric and noninjected.  ENT: Moist mucosal membranes. No mucosal lesions. Teeth in moderateair  Neck: Neck supple without lymphadenopathy. No carotid bruits. No masses palpated.  Cardiovascular: Regular rate with normal S1-S2 sounds. No murmurs, rubs, gallops auscultated. No JVD.  Respiratory: Good respiratory effort with no wheezes, rales, rhonchi. Lungs clear to auscultation bilaterally.  Abdomen: Soft, nontender, nondistended. Active bowel sounds. No masses or hepatosplenomegaly  Skin: Dry, warm to touch. 2+ dorsalis pedis and radial pulses. 7 cm erythemic area to the right pretibial or area with central eschar following incision and drainage. No drainage. The area is exquisitely tender to palpation. Musculoskeletal: No calf or leg  pain. All major joints not erythematous nontender.  Psychiatric: Intact judgment and insight.  Neurologic: No focal neurological deficits. Cranial nerves II through XII are grossly intact.           Labs on Admission:  Basic Metabolic Panel:  Recent Labs Lab 11/12/14 1200 11/15/14 1050  NA 139 142  K 4.3 3.8  CL 110 107  CO2 22  22  GLUCOSE 112* 179*  BUN 15 20  CREATININE 1.19* 1.29*  CALCIUM 9.1 9.3   Liver Function Tests: No results for input(s): AST, ALT, ALKPHOS, BILITOT, PROT, ALBUMIN in the last 168 hours. No results for input(s): LIPASE, AMYLASE in the last 168 hours. No results for input(s): AMMONIA in the last 168 hours. CBC:  Recent Labs Lab 11/12/14 1200 11/15/14 1050  WBC 7.5 6.7  NEUTROABS 4.9  --   HGB 17.1* 17.4*  HCT 49.3* 49.9*  MCV 94.4 94.7  PLT 240 227   Cardiac Enzymes: No results for input(s): CKTOTAL, CKMB, CKMBINDEX, TROPONINI in the last 168 hours.  BNP (last 3 results) No results for input(s): BNP in the last 8760 hours.  ProBNP (last 3 results) No results for input(s): PROBNP in the last 8760 hours.  CBG: No results for input(s): GLUCAP in the last 168 hours.  Radiological Exams on Admission: Dg Tibia/fibula Right  11/15/2014   CLINICAL DATA:  Bite by a spider 1 week ago with swelling, redness, and lesion on the distal fibula. Initial encounter.  EXAM: RIGHT TIBIA AND FIBULA - 2 VIEW  COMPARISON:  None.  FINDINGS: There is no evidence of fracture or other focal bone lesions. Soft tissue thickening over the lower shin with superimposed chronic venous type calcifications. Incidental plantar calcaneal spur.  IMPRESSION: Soft tissue swelling without soft tissue gas or osseous abnormality.   Electronically Signed   By: Monte Fantasia M.D.   On: 11/15/2014 11:23    Assessment/Plan Present on Admission:  . Cellulitis of right lower leg . Acute renal injury  This patient was discussed with the ED physician, including pertinent vitals, physical exam findings, labs, and imaging.  We also discussed care given by the ED provider.  Admit to MedSurg As the patient never took the clindamycin, will obtain a wound culture, which was omitted in the first ER visit, patient on clindamycin. X-ray shows no evidence of bony involvement. Repeat CBC and metabolic panel in the morning. IV  fluids for acute renal injury. It does not appear to be improving, possibly obtain an MRI and consult surgery. We'll also obtain HIV  DVT prophylaxis: Lovenox  Consultants: none  Code Status: full code  Family Communication: none    Disposition Plan: home following improvement  Truett Mainland, DO [facility] Pager [phone]

## 2014-11-15 NOTE — Progress Notes (Signed)
Was called by ED PA stating that patient presented with worsening cellulitis on the right lower leg after being bitten by a spider 3 days prior to admission. Patient at that time had a IND done and prescribed oral clindamycin and supposedly per ED PA patient stated she took the medications and cellulitis has worsened and as such patient failed outpatient treatment and requesting patient be admitted for IV antibiotics. Looking through Epic case manager was called on 11/12/2014 stating that patient could not afford medications and supposedly patient was entered into the match program in order to get her medications. CBC done with a normal white count. Patient afebrile. Patient given a dose of IV vancomycin. Patient accepted to med Surg bed at [facility].

## 2014-11-15 NOTE — ED Notes (Signed)
Attempted to call report, secretary states nurses is tied up and will call me back.  Informed that transport will be coming shortly.

## 2014-11-16 DIAGNOSIS — R748 Abnormal levels of other serum enzymes: Secondary | ICD-10-CM

## 2014-11-16 LAB — BASIC METABOLIC PANEL
Anion gap: 7 (ref 5–15)
BUN: 18 mg/dL (ref 6–20)
CALCIUM: 8.7 mg/dL — AB (ref 8.9–10.3)
CO2: 23 mmol/L (ref 22–32)
Chloride: 109 mmol/L (ref 101–111)
Creatinine, Ser: 1.17 mg/dL — ABNORMAL HIGH (ref 0.44–1.00)
GFR calc Af Amer: >60 mL/min (ref >60)
GFR calc non Af Amer: 53 mL/min — ABNORMAL LOW (ref >60)
Glucose, Bld: 86 mg/dL (ref 65–99)
Potassium: 4.6 mmol/L (ref 3.5–5.1)
Sodium: 139 mmol/L (ref 135–145)

## 2014-11-16 LAB — CBC
HCT: 43.3 % (ref 36.0–46.0)
Hemoglobin: 14.5 g/dL (ref 12.0–15.0)
MCH: 32.2 pg (ref 26.0–34.0)
MCHC: 33.5 g/dL (ref 30.0–36.0)
MCV: 96.2 fL (ref 78.0–100.0)
Platelets: 221 10*3/uL (ref 150–400)
RBC: 4.5 MIL/uL (ref 3.87–5.11)
RDW: 13 % (ref 11.5–15.5)
WBC: 5.2 10*3/uL (ref 4.0–10.5)

## 2014-11-16 LAB — HIV ANTIBODY (ROUTINE TESTING W REFLEX): HIV Screen 4th Generation wRfx: NONREACTIVE

## 2014-11-16 MED ORDER — PIPERACILLIN-TAZOBACTAM 3.375 G IVPB
3.3750 g | Freq: Three times a day (TID) | INTRAVENOUS | Status: DC
  Administered 2014-11-16 – 2014-11-18 (×6): 3.375 g via INTRAVENOUS
  Filled 2014-11-16 (×8): qty 50

## 2014-11-16 MED ORDER — MORPHINE SULFATE 2 MG/ML IJ SOLN
2.0000 mg | INTRAMUSCULAR | Status: DC | PRN
  Administered 2014-11-16 – 2014-11-18 (×6): 2 mg via INTRAVENOUS
  Filled 2014-11-16 (×6): qty 1

## 2014-11-16 MED ORDER — VANCOMYCIN HCL 10 G IV SOLR
1250.0000 mg | Freq: Once | INTRAVENOUS | Status: AC
  Administered 2014-11-16: 1250 mg via INTRAVENOUS
  Filled 2014-11-16: qty 1250

## 2014-11-16 MED ORDER — PIPERACILLIN-TAZOBACTAM 3.375 G IVPB 30 MIN
3.3750 g | Freq: Once | INTRAVENOUS | Status: AC
  Administered 2014-11-16: 3.375 g via INTRAVENOUS
  Filled 2014-11-16: qty 50

## 2014-11-16 MED ORDER — VANCOMYCIN HCL 10 G IV SOLR
1250.0000 mg | INTRAVENOUS | Status: DC
  Administered 2014-11-17 – 2014-11-18 (×2): 1250 mg via INTRAVENOUS
  Filled 2014-11-16 (×2): qty 1250

## 2014-11-16 NOTE — Care Management Note (Signed)
Case Management Note  Patient Details  Name: Ambriel Gorelick MRN: [id number] Date of Birth: [birth year]  Subjective/Objective:                    Action/Plan: Will provide patient with [place] letter  ( medication assistance ) on day of discharge .  Please notify NCM. Will continue to follow   Expected Discharge Date:                  Expected Discharge Plan:  Home/Self Care  In-House Referral:     Discharge planning Services  CM Consult, [place] Program, [facility], Medication Assistance  Post Acute Care Choice:    Choice offered to:     DME Arranged:    DME Agency:     HH Arranged:    HH Agency:     Status of Service:  In process, will continue to follow  Medicare Important Message Given:    Date Medicare IM Given:    Medicare IM give by:    Date Additional Medicare IM Given:    Additional Medicare Important Message give by:     If discussed at [place] of Stay Meetings, dates discussed:    Additional Comments:  Marilu Favre, RN 11/16/2014, 9:57 AM

## 2014-11-16 NOTE — Progress Notes (Signed)
ANTIBIOTIC CONSULT NOTE - INITIAL  Pharmacy Consult:  Vancomycin / Zosyn Indication:  Cellulitis  Allergies  Allergen Reactions  . Asa Buff (Mag [Buffered Aspirin] Nausea Only  . Chocolate Nausea And Vomiting  . Compazine Hives    Patient Measurements: Height: 5\' 2"  (157.5 cm) Weight: 165 lb (74.844 kg) IBW/kg (Calculated) : 50.1  Vital Signs: Temp: 98.7 F (37.1 C) (07/19 0635) Temp Source: Oral (07/19 0635) BP: 131/68 mmHg (07/19 0635) Pulse Rate: 70 (07/19 0635) Intake/Output from previous day: 07/18 0701 - 07/19 0700 In: 1022.5 [P.O.:360; I.V.:562.5; IV Piggyback:100] Out: 300 [Urine:300]  Labs:  Recent Labs  11/15/14 1050 11/16/14 0401  WBC 6.7 5.2  HGB 17.4* 14.5  PLT 227 221  CREATININE 1.29* 1.17*   Estimated Creatinine Clearance: 53.9 mL/min (by C-G formula based on Cr of 1.17). No results for input(s): VANCOTROUGH, VANCOPEAK, VANCORANDOM, GENTTROUGH, GENTPEAK, GENTRANDOM, TOBRATROUGH, TOBRAPEAK, TOBRARND, AMIKACINPEAK, AMIKACINTROU, AMIKACIN in the last 72 hours.   Microbiology: Recent Results (from the past 720 hour(s))  Blood culture (routine x 2)     Status: None (Preliminary result)   Collection Time: 11/12/14 12:00 PM  Result Value Ref Range Status   Specimen Description BLOOD LEFT FOREARM  Final   Special Requests BOTTLES DRAWN AEROBIC AND ANAEROBIC 5CC EACH  Final   Culture   Final    NO GROWTH 3 DAYS Performed at [facility]    Report Status PENDING  Incomplete  Blood culture (routine x 2)     Status: None (Preliminary result)   Collection Time: 11/12/14  4:40 PM  Result Value Ref Range Status   Specimen Description BLOOD RIGHT ANTECUBITAL  Final   Special Requests BOTTLES DRAWN AEROBIC AND ANAEROBIC 5CC EACH  Final   Culture   Final    NO GROWTH 3 DAYS Performed at [facility]    Report Status PENDING  Incomplete  Blood culture (routine x 2)     Status: None (Preliminary result)   Collection Time: 11/15/14 11:15 AM   Result Value Ref Range Status   Specimen Description   Final    BLOOD RIGHT ANTECUBITAL Performed at [facility]    Special Requests BOTTLES DRAWN AEROBIC AND ANAEROBIC 2CC  Final   Culture PENDING  Incomplete   Report Status PENDING  Incomplete  Wound culture     Status: None (Preliminary result)   Collection Time: 11/15/14  6:43 PM  Result Value Ref Range Status   Specimen Description WOUND RIGHT LEG  Final   Special Requests Normal  Final   Gram Stain   Final    FEW WBC PRESENT, PREDOMINANTLY PMN NO SQUAMOUS EPITHELIAL CELLS SEEN FEW GRAM POSITIVE COCCI IN PAIRS Performed at [facility]    Culture   Final    NO GROWTH 1 DAY Performed at [facility]    Report Status PENDING  Incomplete    Medical History: Past Medical History  Diagnosis Date  . History of hiatal hernia   . Cellulitis of right lower leg     "I&D in ER on 11/12/2014; admitted 11/15/2014"  . Asthma with bronchitis   . TIA (transient ischemic attack) ~ 2011    "mild", denies residual on 11/15/2014  . Melanoma of back   . Cervical cancer     "they froze it"      Assessment: 72 YOF with history of I&D of RLE abscess in the ED on 11/12/14, returned to the ED with worsening cellulitis.  Pharmacy consulted  to manage vancomycin and Zosyn.  Noted patient received vancomycin 1g IV on 11/15/14 around 1100.  Baseline labs reviewed.  Vanc 7/18 >> Zosyn 7/19 >>  7/18 right leg wound cx - few GPC 7/18  BCx x2   Goal of Therapy:  Vancomycin trough level 10-15 mcg/ml   Plan:  - Vanc 1250mg  IV Q24H - Zosyn 3.375gm IV Q8H, 4 hr infusion - Monitor renal fxn, clinical progress, vanc trough as indicated    Shallyn Constancio D. Mina Marble, PharmD, BCPS Pager:  [phone] 11/16/2014, 10:26 AM

## 2014-11-16 NOTE — Progress Notes (Addendum)
[facility] PROGRESS NOTE  Breanna Sullivan YBO:[id number] DOB: 1960-09-17 DOA: 11/15/2014   Brief HPI: 54 year old Caucasian female presented with three-day history of worsening redness, pain and swelling in the right lower extremity. She apparently had some Insect or Spider Bite earlier. She was found to have an abscess which was incised and drained. She was prescribed clindamycin. However, she could not fill this prescription and so she returned to the emergency department due to increasing pain. She was admitted to the hospital.  Past medical history:  Past Medical History  Diagnosis Date  . History of hiatal hernia   . Cellulitis of right lower leg     "I&D in ER on 11/12/2014; admitted 11/15/2014"  . Asthma with bronchitis   . TIA (transient ischemic attack) ~ 2011    "mild", denies residual on 11/15/2014  . Melanoma of back   . Cervical cancer     "they froze it"    Consultants: None.   Procedures: None  Antibiotics: Initially started on clindamycin. Changed over to vancomycin and Zosyn today  Subjective: Patient continues to have pain in the right lower extremity, though she tells me that it is slightly better. The redness appears to spread a little bit more. Denies any shortness of breath or chest pain. No nausea, vomiting. The pain is 8 out of 10 in intensity.  Objective: Vital Signs  Filed Vitals:   11/15/14 1223 11/15/14 1451 11/15/14 2206 11/16/14 0635  BP: 127/74 113/71 126/73 131/68  Pulse: 72 70 65 70  Temp: 97.6 F (36.4 C) 97.6 F (36.4 C) 97.7 F (36.5 C) 98.7 F (37.1 C)  TempSrc: Oral Oral Axillary Oral  Resp: 18 18 18 18   Height:      Weight:      SpO2: 99% 98% 98% 98%    Intake/Output Summary (Last 24 hours) at 11/16/14 1013 Last data filed at 11/16/14 0600  Gross per 24 hour  Intake 1022.5 ml  Output    300 ml  Net  722.5 ml   Filed Weights   11/15/14 1029  Weight: 74.844 kg (165 lb)    General appearance: alert, cooperative,  appears stated age and no distress Resp: clear to auscultation bilaterally Cardio: regular rate and rhythm, S1, S2 normal, no murmur, click, rub or gallop GI: soft, non-tender; bowel sounds normal; no masses,  no organomegaly Right lower extremity reveals an area, measuring about 10 cm x 10 cm in the lower leg on the anterior aspect. Peritonitis, warm to touch. Tender to palpation. No fluctuance. Pulses: 2+ and symmetric Neurologic: Alert and oriented X 3, normal strength and tone. Normal symmetric reflexes. Normal coordination and gait  Lab Results:  Basic Metabolic Panel:  Recent Labs Lab 11/12/14 1200 11/15/14 1050 11/16/14 0401  NA 139 142 139  K 4.3 3.8 4.6  CL 110 107 109  CO2 22 22 23   GLUCOSE 112* 179* 86  BUN 15 20 18   CREATININE 1.19* 1.29* 1.17*  CALCIUM 9.1 9.3 8.7*   CBC:  Recent Labs Lab 11/12/14 1200 11/15/14 1050 11/16/14 0401  WBC 7.5 6.7 5.2  NEUTROABS 4.9  --   --   HGB 17.1* 17.4* 14.5  HCT 49.3* 49.9* 43.3  MCV 94.4 94.7 96.2  PLT 240 227 221    CBG: No results for input(s): GLUCAP in the last 168 hours.  Recent Results (from the past 240 hour(s))  Blood culture (routine x 2)     Status: None (Preliminary result)   Collection Time:  11/12/14 12:00 PM  Result Value Ref Range Status   Specimen Description BLOOD LEFT FOREARM  Final   Special Requests BOTTLES DRAWN AEROBIC AND ANAEROBIC 5CC EACH  Final   Culture   Final    NO GROWTH 3 DAYS Performed at [facility]    Report Status PENDING  Incomplete  Blood culture (routine x 2)     Status: None (Preliminary result)   Collection Time: 11/12/14  4:40 PM  Result Value Ref Range Status   Specimen Description BLOOD RIGHT ANTECUBITAL  Final   Special Requests BOTTLES DRAWN AEROBIC AND ANAEROBIC 5CC EACH  Final   Culture   Final    NO GROWTH 3 DAYS Performed at [facility]    Report Status PENDING  Incomplete  Blood culture (routine x 2)     Status: None (Preliminary result)     Collection Time: 11/15/14 11:15 AM  Result Value Ref Range Status   Specimen Description   Final    BLOOD RIGHT ANTECUBITAL Performed at [facility]    Special Requests BOTTLES DRAWN AEROBIC AND ANAEROBIC [facility]  Final   Culture PENDING  Incomplete   Report Status PENDING  Incomplete  Wound culture     Status: None (Preliminary result)   Collection Time: 11/15/14  6:43 PM  Result Value Ref Range Status   Specimen Description WOUND RIGHT LEG  Final   Special Requests Normal  Final   Gram Stain   Final    FEW WBC PRESENT, PREDOMINANTLY PMN NO SQUAMOUS EPITHELIAL CELLS SEEN FEW GRAM POSITIVE COCCI IN PAIRS Performed at [facility]    Culture   Final    NO GROWTH 1 DAY Performed at [facility]    Report Status PENDING  Incomplete      Studies/Results: Dg Tibia/fibula Right  11/15/2014   CLINICAL DATA:  Bite by a spider 1 week ago with swelling, redness, and lesion on the distal fibula. Initial encounter.  EXAM: RIGHT TIBIA AND FIBULA - 2 VIEW  COMPARISON:  None.  FINDINGS: There is no evidence of fracture or other focal bone lesions. Soft tissue thickening over the lower shin with superimposed chronic venous type calcifications. Incidental plantar calcaneal spur.  IMPRESSION: Soft tissue swelling without soft tissue gas or osseous abnormality.   Electronically Signed   By: Monte Fantasia M.D.   On: 11/15/2014 11:23    Medications:  Scheduled: . enoxaparin (LOVENOX) injection  40 mg Subcutaneous Q24H  . piperacillin-tazobactam (ZOSYN)  IV  3.375 g Intravenous Q8H  . pneumococcal 23 valent vaccine  0.5 mL Intramuscular Tomorrow-1000  . [START ON 11/17/2014] vancomycin  1,250 mg Intravenous Q24H   Continuous:  WEX:[id number] **OR** acetaminophen, morphine injection, ondansetron **OR** ondansetron (ZOFRAN) IV, oxyCODONE, senna-docusate, zolpidem  Assessment/Plan:  Active Problems:   Cellulitis of right lower leg   Acute renal injury    Acute  right lower extremity cellulitis Patient is hemodynamically stable. WBCs is normal. Erythema, however, seems to be extending. We will change her to vancomycin and Zosyn for now. Follow up on culture reports. Pain control. She reports that the swelling has actually improved since yesterday despite the spread in the erythema. No indication for imaging studies at this time. Continue to monitor for now.  Mildly elevated creatinine Improved with IV fluids. Making urine. Continue to monitor. 4. Labs reviewed. She does have slightly elevated creatinine at baseline.   DVT Prophylaxis: Lovenox    Code Status: Full code  Family Communication: Discussed  with the patient  Disposition Plan: Continue antibiotics. Await improvement.     LOS: 1 day   [facility] Pager [phone] 11/16/2014, 10:13 AM  If 7PM-7AM, please contact night-coverage at [email or web address], password [facility]

## 2014-11-17 DIAGNOSIS — L03115 Cellulitis of right lower limb: Principal | ICD-10-CM

## 2014-11-17 LAB — CBC
HCT: 43.6 % (ref 36.0–46.0)
Hemoglobin: 14.7 g/dL (ref 12.0–15.0)
MCH: 32.3 pg (ref 26.0–34.0)
MCHC: 33.7 g/dL (ref 30.0–36.0)
MCV: 95.8 fL (ref 78.0–100.0)
PLATELETS: 254 10*3/uL (ref 150–400)
RBC: 4.55 MIL/uL (ref 3.87–5.11)
RDW: 12.8 % (ref 11.5–15.5)
WBC: 4.7 10*3/uL (ref 4.0–10.5)

## 2014-11-17 LAB — CULTURE, BLOOD (ROUTINE X 2)
CULTURE: NO GROWTH
CULTURE: NO GROWTH

## 2014-11-17 LAB — BASIC METABOLIC PANEL
Anion gap: 9 (ref 5–15)
BUN: 18 mg/dL (ref 6–20)
CHLORIDE: 107 mmol/L (ref 101–111)
CO2: 24 mmol/L (ref 22–32)
Calcium: 8.7 mg/dL — ABNORMAL LOW (ref 8.9–10.3)
Creatinine, Ser: 1.31 mg/dL — ABNORMAL HIGH (ref 0.44–1.00)
GFR calc non Af Amer: 46 mL/min — ABNORMAL LOW (ref >60)
GFR, EST AFRICAN AMERICAN: 54 mL/min — AB (ref >60)
Glucose, Bld: 149 mg/dL — ABNORMAL HIGH (ref 65–99)
Potassium: 4.1 mmol/L (ref 3.5–5.1)
Sodium: 140 mmol/L (ref 135–145)

## 2014-11-17 NOTE — Progress Notes (Signed)
[facility] PROGRESS NOTE  Breanna Sullivan ZTI:[id number] DOB: 1960-08-16 DOA: 11/15/2014   Brief HPI: 54 year old Caucasian female presented with three-day history of worsening redness, pain and swelling in the right lower extremity. She apparently had some Insect or Spider Bite earlier. She was found to have an abscess which was incised and drained. She was prescribed clindamycin. However, she could not fill this prescription and so she returned to the emergency department due to increasing pain. She was admitted to the hospital.  Past medical history:  Past Medical History  Diagnosis Date  . History of hiatal hernia   . Cellulitis of right lower leg     "I&D in ER on 11/12/2014; admitted 11/15/2014"  . Asthma with bronchitis   . TIA (transient ischemic attack) ~ 2011    "mild", denies residual on 11/15/2014  . Melanoma of back   . Cervical cancer     "they froze it"    Consultants: None.   Procedures: None  Antibiotics: Initially started on clindamycin. Changed over to vancomycin and Zosyn today  Subjective: Patient felling better, less pain, Denies any shortness of breath or chest pain. No nausea, vomiting.   Objective: Vital Signs  Filed Vitals:   11/16/14 1352 11/16/14 2146 11/17/14 0416 11/17/14 1324  BP: 130/111 133/78 100/57 111/59  Pulse: 67 70 67 72  Temp: 98.2 F (36.8 C) 97.9 F (36.6 C) 98 F (36.7 C) 98.2 F (36.8 C)  TempSrc: Oral Oral Oral Oral  Resp: 16 17 15 18   Height:      Weight:      SpO2: 96% 96% 98% 98%    Intake/Output Summary (Last 24 hours) at 11/17/14 1724 Last data filed at 11/17/14 0858  Gross per 24 hour  Intake    530 ml  Output   1150 ml  Net   -620 ml   Filed Weights   11/15/14 1029  Weight: 74.844 kg (165 lb)    General appearance: alert, cooperative, appears stated age and no distress Resp: clear to auscultation bilaterally Cardio: regular rate and rhythm, S1, S2 normal, no murmur, click, rub or gallop GI: soft,  non-tender; bowel sounds normal; no masses,  no organomegaly Right lower extremity reveals an area, measuring about 10 cm x 10 cm in the lower leg on the anterior aspect. Peritonitis, warm to touch. Tender to palpation. No fluctuance. Pulses: 2+ and symmetric Neurologic: Alert and oriented X 3, normal strength and tone. Normal symmetric reflexes. Normal coordination and gait  Lab Results:  Basic Metabolic Panel:  Recent Labs Lab 11/12/14 1200 11/15/14 1050 11/16/14 0401 11/17/14 0425  NA 139 142 139 140  K 4.3 3.8 4.6 4.1  CL 110 107 109 107  CO2 22 22 23 24   GLUCOSE 112* 179* 86 149*  BUN 15 20 18 18   CREATININE 1.19* 1.29* 1.17* 1.31*  CALCIUM 9.1 9.3 8.7* 8.7*   CBC:  Recent Labs Lab 11/12/14 1200 11/15/14 1050 11/16/14 0401 11/17/14 0425  WBC 7.5 6.7 5.2 4.7  NEUTROABS 4.9  --   --   --   HGB 17.1* 17.4* 14.5 14.7  HCT 49.3* 49.9* 43.3 43.6  MCV 94.4 94.7 96.2 95.8  PLT 240 227 221 254    CBG: No results for input(s): GLUCAP in the last 168 hours.  Recent Results (from the past 240 hour(s))  Blood culture (routine x 2)     Status: None   Collection Time: 11/12/14 12:00 PM  Result Value Ref Range Status  Specimen Description BLOOD LEFT FOREARM  Final   Special Requests BOTTLES DRAWN AEROBIC AND ANAEROBIC 5CC EACH  Final   Culture   Final    NO GROWTH 5 DAYS Performed at [facility]    Report Status 11/17/2014 FINAL  Final  Blood culture (routine x 2)     Status: None   Collection Time: 11/12/14  4:40 PM  Result Value Ref Range Status   Specimen Description BLOOD RIGHT ANTECUBITAL  Final   Special Requests BOTTLES DRAWN AEROBIC AND ANAEROBIC 5CC EACH  Final   Culture   Final    NO GROWTH 5 DAYS Performed at [facility]    Report Status 11/17/2014 FINAL  Final  Blood culture (routine x 2)     Status: None (Preliminary result)   Collection Time: 11/15/14 11:15 AM  Result Value Ref Range Status   Specimen Description BLOOD RIGHT  ANTECUBITAL  Final   Special Requests BOTTLES DRAWN AEROBIC AND ANAEROBIC 2CC  Final   Culture   Final    NO GROWTH 2 DAYS Performed at [facility]    Report Status PENDING  Incomplete  Blood culture (routine x 2)     Status: None (Preliminary result)   Collection Time: 11/15/14 12:10 PM  Result Value Ref Range Status   Specimen Description BLOOD LEFT HAND  Final   Special Requests BOTTLES DRAWN AEROBIC AND ANAEROBIC 4CC  Final   Culture   Final    NO GROWTH 2 DAYS Performed at [facility]    Report Status PENDING  Incomplete  Wound culture     Status: None (Preliminary result)   Collection Time: 11/15/14  6:43 PM  Result Value Ref Range Status   Specimen Description WOUND RIGHT LEG  Final   Special Requests Normal  Final   Gram Stain   Final    FEW WBC PRESENT, PREDOMINANTLY PMN NO SQUAMOUS EPITHELIAL CELLS SEEN FEW GRAM POSITIVE COCCI IN PAIRS Performed at [facility]    Culture   Final    ABUNDANT STAPHYLOCOCCUS AUREUS Note: RIFAMPIN AND GENTAMICIN SHOULD NOT BE USED AS SINGLE DRUGS FOR TREATMENT OF STAPH INFECTIONS. Performed at [facility]    Report Status PENDING  Incomplete      Studies/Results: No results found.  Medications:  Scheduled: . enoxaparin (LOVENOX) injection  40 mg Subcutaneous Q24H  . piperacillin-tazobactam (ZOSYN)  IV  3.375 g Intravenous Q8H  . vancomycin  1,250 mg Intravenous Q24H   Continuous:  SNK:[id number] **OR** acetaminophen, morphine injection, ondansetron **OR** ondansetron (ZOFRAN) IV, oxyCODONE, senna-docusate, zolpidem  Assessment/Plan:  Active Problems:   Cellulitis of right lower leg   Acute renal injury    Acute right lower extremity cellulitis Patient is hemodynamically stable. WBCs is normal. Erythema, however, seems to be extending.  Improved after changed her to vancomycin and Zosyn.  Follow up on culture reports  + staph. Pain control.   Mildly elevated creatinine Improved  with IV fluids. Making urine. Continue to monitor. 4. Labs reviewed. She does have slightly elevated creatinine at baseline. Will need outpatient follow up with pmd.   DVT Prophylaxis: Lovenox    Code Status: Full code  Family Communication: Discussed with the patient  Disposition Plan: Continue antibiotics. Await improvement/culture result     LOS: 2 days   Orris Perin  [facility] Pager [phone] 11/17/2014, 5:24 PM  If 7PM-7AM, please contact night-coverage at [email or web address], password [facility]

## 2014-11-17 NOTE — Progress Notes (Signed)
Pt currently requesting IV morphine for pain.  I have offered PO tylenol and oxy IR to pt but she says they hurt her stomach and is refusing to take them at this time.  I told pt to make sure she tells MD when they make rounds so they can adjust meds as needed.  Will continue to monitor and encourage oral medications for pain. Breanna Sullivan

## 2014-11-18 LAB — WOUND CULTURE: SPECIAL REQUESTS: NORMAL

## 2014-11-18 LAB — HEMOGLOBIN A1C
HEMOGLOBIN A1C: 5.5 % (ref 4.8–5.6)
Mean Plasma Glucose: 111 mg/dL

## 2014-11-18 MED ORDER — HYDROCODONE-ACETAMINOPHEN 5-325 MG PO TABS: 2.0000 | ORAL_TABLET | ORAL | Status: DC | PRN

## 2014-11-18 MED ORDER — SULFAMETHOXAZOLE-TRIMETHOPRIM 800-160 MG PO TABS: 1.0000 | ORAL_TABLET | Freq: Two times a day (BID) | ORAL | Status: DC

## 2014-11-18 MED ORDER — SENNOSIDES-DOCUSATE SODIUM 8.6-50 MG PO TABS
1.0000 | ORAL_TABLET | Freq: Two times a day (BID) | ORAL | Status: DC
Start: 2014-11-18 — End: 2014-11-18

## 2014-11-18 NOTE — Care Management Note (Signed)
Case Management Note  Patient Details  Name: Breanna Sullivan MRN: [id number] Date of Birth: October 24, 1960  Subjective/Objective:                    Action/Plan:  Will provide [place] letter on discharge day  Expected Discharge Date:                  Expected Discharge Plan:  Home/Self Care  In-House Referral:  Financial Counselor  Discharge planning Services  CM Consult, [place] Program, [facility], Medication Assistance  Post Acute Care Choice:    Choice offered to:     DME Arranged:    DME Agency:     HH Arranged:    HH Agency:     Status of Service:  In process, will continue to follow  Medicare Important Message Given:    Date Medicare IM Given:    Medicare IM give by:    Date Additional Medicare IM Given:    Additional Medicare Important Message give by:     If discussed at [place] of Stay Meetings, dates discussed:    Additional Comments:  Marilu Favre, RN 11/18/2014, 11:43 AM

## 2014-11-18 NOTE — Discharge Summary (Signed)
Discharge Summary  Breanna Sullivan FUX:[id number] DOB: Jul 31, 1960  PCP: No PCP Per Patient  Admit date: 11/15/2014 Discharge date: 11/18/2014  Time spent: <24mins  Recommendations for Outpatient Follow-up:  1. F/u with PMD within one week at [facility]  Discharge Diagnoses:  Active Hospital Problems   Diagnosis Date Noted  . Cellulitis of right lower leg 11/15/2014  . Acute renal injury 11/15/2014    Resolved Hospital Problems   Diagnosis Date Noted Date Resolved  No resolved problems to display.    Discharge Condition: stable  Diet recommendation: heart healthy  Filed Weights   11/15/14 1029  Weight: 74.844 kg (165 lb)    History of present illness:  Breanna Sullivan is a 54 y.o. female  With a history of melanoma who presented to the emergency department 3 days ago after 3 days of worsening redness, pain, and swelling in the right lower extremity. She was found to have an abscess which was incised and drained with minimal purulence. The patient was prescribed clindamycin, although she could not afford the medication and never filled it. The cellulitis worsened over the past 3 days the patient presented to the emergency department at [facility] due to increasing pain. Pain is in the right lower leg that is worsened with standing, weightbearing, pressure. Improved with rest. No further drainage.     Hospital Course:  Active Problems:   Cellulitis of right lower leg   Acute renal injury  Acute right lower extremity cellulitis Patient is hemodynamically stable. WBCs is normal. Erythema, however, seems to be extending initially, Improved after changed her to vancomycin and Zosyn.  culture reports + staph. Sensitive to vanc/oxacillin/bactrim, but resistance to penicillin/clinda/erythro/tetracycline.  She is discharged with bactrim DS for 10days, pmd follow up in one week. Care manager to assist with meds.  Mildly elevated  creatinine Improved with IV fluids. Making urine. Continue to monitor. 4. Labs reviewed. She does have slightly elevated creatinine at baseline. Will need outpatient follow up with pmd.   Procedures:  none  Consultations:  none  Discharge Exam: BP 141/78 mmHg  Pulse 70  Temp(Src) 97.8 F (36.6 C) (Oral)  Resp 17  Ht 5\' 2"  (1.575 m)  Wt 74.844 kg (165 lb)  BMI 30.17 kg/m2  SpO2 100%  General: aaox3 Cardiovascular: RRR Respiratory: CTABL Extremities:Right lower extremity reveals an area with erythema, measuring about 10 cm x 10 cm, with small central necrosis, shallow ulcer with yellow base, cellulitis located in the right lower leg on the anterior aspect just above the ankle. Pain has largely resolved. Erythema subsiding.  Discharge Instructions You were cared for by a hospitalist during your hospital stay. If you have any questions about your discharge medications or the care you received while you were in the hospital after you are discharged, you can call the unit and asked to speak with the hospitalist on call if the hospitalist that took care of you is not available. Once you are discharged, your primary care physician will handle any further medical issues. Please note that NO REFILLS for any discharge medications will be authorized once you are discharged, as it is imperative that you return to your primary care physician (or establish a relationship with a primary care physician if you do not have one) for your aftercare needs so that they can reassess your need for medications and monitor your lab values.      Discharge Instructions    Diet - low sodium heart  healthy    Complete by:  As directed      Increase activity slowly    Complete by:  As directed             Medication List    STOP taking these medications        clindamycin 150 MG capsule  Commonly known as:  CLEOCIN      TAKE these medications        albuterol 108 (90 BASE) MCG/ACT inhaler    Commonly known as:  PROVENTIL HFA;VENTOLIN HFA  Inhale 2 puffs into the lungs every 6 (six) hours as needed for wheezing or shortness of breath.     GOODY HEADACHE PO  Take 1 packet by mouth daily as needed (pain).     HYDROcodone-acetaminophen 5-325 MG per tablet  Commonly known as:  NORCO/VICODIN  Take 2 tablets by mouth every 4 (four) hours as needed.     neomycin-bacitracin-polymyxin Oint  Commonly known as:  NEOSPORIN  Apply 1 application topically 3 (three) times daily as needed for wound care.     ondansetron 4 MG tablet  Commonly known as:  ZOFRAN  Take 1 tablet (4 mg total) by mouth every 6 (six) hours.     sulfamethoxazole-trimethoprim 800-160 MG per tablet  Commonly known as:  BACTRIM DS,SEPTRA DS  Take 1 tablet by mouth 2 (two) times daily.       Allergies  Allergen Reactions  . Asa Buff (Mag [Buffered Aspirin] Nausea Only  . Chocolate Nausea And Vomiting  . Compazine Hives   Follow-up Information    Follow up with [facility] In 1 week.   Why:  hospital discharge follow up, repeat cbc/bmp at follow up   Contact information:   [address] [place] [address] [phone]      Follow up with [facility]    .   Why:  hospital follow up appointment Friday November 26, 2014 at 11 15 am    Contact information:   [address] [place] [address] [phone]       The results of significant diagnostics from this hospitalization (including imaging, microbiology, ancillary and laboratory) are listed below for reference.    Significant Diagnostic Studies: Dg Tibia/fibula Right  11/15/2014   CLINICAL DATA:  Bite by a spider 1 week ago with swelling, redness, and lesion on the distal fibula. Initial encounter.  EXAM: RIGHT TIBIA AND FIBULA - 2 VIEW  COMPARISON:  None.  FINDINGS: There is no evidence of fracture or other focal bone lesions. Soft tissue  thickening over the lower shin with superimposed chronic venous type calcifications. Incidental plantar calcaneal spur.  IMPRESSION: Soft tissue swelling without soft tissue gas or osseous abnormality.   Electronically Signed   By: Monte Fantasia M.D.   On: 11/15/2014 11:23    Microbiology: Recent Results (from the past 240 hour(s))  Blood culture (routine x 2)     Status: None   Collection Time: 11/12/14 12:00 PM  Result Value Ref Range Status   Specimen Description BLOOD LEFT FOREARM  Final   Special Requests BOTTLES DRAWN AEROBIC AND ANAEROBIC 5CC EACH  Final   Culture   Final    NO GROWTH 5 DAYS Performed at [facility]    Report Status 11/17/2014 FINAL  Final  Blood culture (routine x 2)     Status: None   Collection Time: 11/12/14  4:40 PM  Result Value Ref Range Status   Specimen Description BLOOD RIGHT ANTECUBITAL  Final   Special Requests BOTTLES DRAWN AEROBIC AND ANAEROBIC 5CC EACH  Final   Culture   Final    NO GROWTH 5 DAYS Performed at [facility]    Report Status 11/17/2014 FINAL  Final  Blood culture (routine x 2)     Status: None (Preliminary result)   Collection Time: 11/15/14 11:15 AM  Result Value Ref Range Status   Specimen Description BLOOD RIGHT ANTECUBITAL  Final   Special Requests BOTTLES DRAWN AEROBIC AND ANAEROBIC 2CC  Final   Culture   Final    NO GROWTH 3 DAYS Performed at [facility]    Report Status PENDING  Incomplete  Blood culture (routine x 2)     Status: None (Preliminary result)   Collection Time: 11/15/14 12:10 PM  Result Value Ref Range Status   Specimen Description BLOOD LEFT HAND  Final   Special Requests BOTTLES DRAWN AEROBIC AND ANAEROBIC 4CC  Final   Culture   Final    NO GROWTH 3 DAYS Performed at [facility]    Report Status PENDING  Incomplete  Wound culture     Status: None   Collection Time: 11/15/14  6:43 PM  Result Value Ref Range Status   Specimen Description WOUND RIGHT LEG  Final    Special Requests Normal  Final   Gram Stain   Final    FEW WBC PRESENT, PREDOMINANTLY PMN NO SQUAMOUS EPITHELIAL CELLS SEEN FEW GRAM POSITIVE COCCI IN PAIRS Performed at [facility]    Culture   Final    ABUNDANT STAPHYLOCOCCUS AUREUS Note: RIFAMPIN AND GENTAMICIN SHOULD NOT BE USED AS SINGLE DRUGS FOR TREATMENT OF STAPH INFECTIONS. Performed at [facility]    Report Status 11/18/2014 FINAL  Final   Organism ID, Bacteria STAPHYLOCOCCUS AUREUS  Final      Susceptibility   Staphylococcus aureus - MIC*    CLINDAMYCIN >=8 RESISTANT Resistant     ERYTHROMYCIN >=8 RESISTANT Resistant     GENTAMICIN <=0.5 SENSITIVE Sensitive     LEVOFLOXACIN 0.25 SENSITIVE Sensitive     OXACILLIN 0.5 SENSITIVE Sensitive     PENICILLIN >=0.5 RESISTANT Resistant     RIFAMPIN <=0.5 SENSITIVE Sensitive     TRIMETH/SULFA <=10 SENSITIVE Sensitive     VANCOMYCIN 1 SENSITIVE Sensitive     TETRACYCLINE >=16 RESISTANT Resistant     MOXIFLOXACIN <=0.25 SENSITIVE Sensitive     * ABUNDANT STAPHYLOCOCCUS AUREUS     Labs: Basic Metabolic Panel:  Recent Labs Lab 11/12/14 1200 11/15/14 1050 11/16/14 0401 11/17/14 0425  NA 139 142 139 140  K 4.3 3.8 4.6 4.1  CL 110 107 109 107  CO2 22 22 23 24   GLUCOSE 112* 179* 86 149*  BUN 15 20 18 18   CREATININE 1.19* 1.29* 1.17* 1.31*  CALCIUM 9.1 9.3 8.7* 8.7*   Liver Function Tests: No results for input(s): AST, ALT, ALKPHOS, BILITOT, PROT, ALBUMIN in the last 168 hours. No results for input(s): LIPASE, AMYLASE in the last 168 hours. No results for input(s): AMMONIA in the last 168 hours. CBC:  Recent Labs Lab 11/12/14 1200 11/15/14 1050 11/16/14 0401 11/17/14 0425  WBC 7.5 6.7 5.2 4.7  NEUTROABS 4.9  --   --   --   HGB 17.1* 17.4* 14.5 14.7  HCT 49.3* 49.9* 43.3 43.6  MCV 94.4 94.7 96.2 95.8  PLT 240 227 221 254   Cardiac Enzymes:  No results for input(s): CKTOTAL, CKMB, CKMBINDEX, TROPONINI in the last 168 hours. BNP: BNP (last 3  results) No results for input(s): BNP in the last 8760 hours.  ProBNP (last 3 results) No results for input(s): PROBNP in the last 8760 hours.  CBG: No results for input(s): GLUCAP in the last 168 hours.     SignedFlorencia Reasons MD, PhD  [facility] 11/18/2014, 2:51 PM

## 2014-11-18 NOTE — Progress Notes (Signed)
Pt given discharge instructions and follow up information.  She verbalized understanding of all instructions.  Prescriptions given and pt already has [facility] letter for med assist.  No further questions or concerns.  Taken down via wheelchair to discharge home with a friend.

## 2014-11-18 NOTE — Care Management Note (Signed)
Case Management Note  Patient Details  Name: Emelda Kohlbeck MRN: [id number] Date of Birth: Oct 20, 1960  Subjective/Objective:                    Action/Plan:  [place] letter given and explained . Follow up appointment scheduled for November 26, 2014 at 11 15 am at [facility] . Patient given information and voiced understanding .   Expected Discharge Date:                  Expected Discharge Plan:  Home/Self Care  In-House Referral:  Financial Counselor  Discharge planning Services  CM Consult, [place] Program, [facility], Medication Assistance  Post Acute Care Choice:    Choice offered to:     DME Arranged:    DME Agency:     HH Arranged:    HH Agency:     Status of Service:  Completed, signed off  Medicare Important Message Given:    Date Medicare IM Given:    Medicare IM give by:    Date Additional Medicare IM Given:    Additional Medicare Important Message give by:     If discussed at [place] of Stay Meetings, dates discussed:    Additional Comments:  Marilu Favre, RN 11/18/2014, 2:46 PM

## 2014-11-20 LAB — CULTURE, BLOOD (ROUTINE X 2)
CULTURE: NO GROWTH
Culture: NO GROWTH

## 2014-11-26 ENCOUNTER — Inpatient Hospital Stay: Payer: Self-pay | Admitting: Internal Medicine

## 2015-05-02 ENCOUNTER — Emergency Department (HOSPITAL_BASED_OUTPATIENT_CLINIC_OR_DEPARTMENT_OTHER)
Admission: EM | Admit: 2015-05-02 | Discharge: 2015-05-02 | Disposition: A | Discharge status: Home / Self Care | Payer: Self-pay | Attending: Emergency Medicine | Admitting: Emergency Medicine

## 2015-05-02 ENCOUNTER — Emergency Department (HOSPITAL_BASED_OUTPATIENT_CLINIC_OR_DEPARTMENT_OTHER): Payer: Self-pay

## 2015-05-02 ENCOUNTER — Encounter (HOSPITAL_BASED_OUTPATIENT_CLINIC_OR_DEPARTMENT_OTHER): Payer: Self-pay | Admitting: *Deleted

## 2015-05-02 DIAGNOSIS — Z79899 Other long term (current) drug therapy: Secondary | ICD-10-CM | POA: Insufficient documentation

## 2015-05-02 DIAGNOSIS — J449 Chronic obstructive pulmonary disease, unspecified: Secondary | ICD-10-CM

## 2015-05-02 DIAGNOSIS — F17200 Nicotine dependence, unspecified, uncomplicated: Secondary | ICD-10-CM | POA: Insufficient documentation

## 2015-05-02 DIAGNOSIS — Z8673 Personal history of transient ischemic attack (TIA), and cerebral infarction without residual deficits: Secondary | ICD-10-CM | POA: Insufficient documentation

## 2015-05-02 DIAGNOSIS — Z85820 Personal history of malignant melanoma of skin: Secondary | ICD-10-CM | POA: Insufficient documentation

## 2015-05-02 DIAGNOSIS — Z872 Personal history of diseases of the skin and subcutaneous tissue: Secondary | ICD-10-CM | POA: Insufficient documentation

## 2015-05-02 DIAGNOSIS — J441 Chronic obstructive pulmonary disease with (acute) exacerbation: Secondary | ICD-10-CM | POA: Insufficient documentation

## 2015-05-02 DIAGNOSIS — Z8719 Personal history of other diseases of the digestive system: Secondary | ICD-10-CM | POA: Insufficient documentation

## 2015-05-02 DIAGNOSIS — Z792 Long term (current) use of antibiotics: Secondary | ICD-10-CM | POA: Insufficient documentation

## 2015-05-02 DIAGNOSIS — Z8541 Personal history of malignant neoplasm of cervix uteri: Secondary | ICD-10-CM | POA: Insufficient documentation

## 2015-05-02 MED ORDER — ALBUTEROL SULFATE HFA 108 (90 BASE) MCG/ACT IN AERS
2.0000 | INHALATION_SPRAY | RESPIRATORY_TRACT | Status: DC | PRN
  Administered 2015-05-02: 2 via RESPIRATORY_TRACT
  Filled 2015-05-02: qty 6.7

## 2015-05-02 MED ORDER — ALBUTEROL SULFATE (2.5 MG/3ML) 0.083% IN NEBU
2.5000 mg | INHALATION_SOLUTION | Freq: Once | RESPIRATORY_TRACT | Status: AC
  Administered 2015-05-02: 2.5 mg via RESPIRATORY_TRACT
  Filled 2015-05-02: qty 3

## 2015-05-02 NOTE — ED Provider Notes (Signed)
CSN: [id number]     Arrival date & time 05/02/15  1634 History  By signing my name below, I, Breanna Sullivan, attest that this documentation has been prepared under the direction and in the presence of Pattricia Boss, MD. Electronically Signed: Helane Sullivan, ED Scribe. 05/02/2015. 6:55 PM.    Chief Complaint  Patient presents with  . Shortness of Breath   The history is provided by the patient. No language interpreter was used.   HPI Comments: Breanna Sullivan is a 55 y.o. female smoker at 0.12 ppd with a PMHx of asthma with bronchitis and TIA who presents to the Emergency Department complaining of SOB onset 1 day ago. Pt states she has bronchitis and has rtun out of her albuterol inhaler. She notes she typically uses her inhaler 3x/day. She reports associated cough and wheezing. She notes she has not smoked anything for the past week. She states that since she has lost her insurance, she no longer has a PCP. She denies a PMHx of DVT and PE. She also denies recent travel or surgery. Pt denies fever and chills.   Past Medical History  Diagnosis Date  . History of hiatal hernia   . Cellulitis of right lower leg     "I&D in ER on 11/12/2014; admitted 11/15/2014"  . Asthma with bronchitis   . TIA (transient ischemic attack) ~ 2011    "mild", denies residual on 11/15/2014  . Melanoma of back ([place])   . Cervical cancer [facility])     "they froze it"   Past Surgical History  Procedure Laterality Date  . Melanoma excision  2011    "back"  . Tubal ligation  1990  . Gynecologic cryosurgery      "for cervical cancer"   No family history on file. Social History  Substance Use Topics  . Smoking status: Current Every Day Smoker -- 0.12 packs/day for 34 years  . Smokeless tobacco: Never Used  . Alcohol Use: No   OB History    No data available     Review of Systems  Constitutional: Negative for fever and chills.  Respiratory: Positive for shortness of breath and wheezing.   All other systems reviewed  and are negative.   Allergies  Asa buff (mag; Chocolate; and Compazine  Home Medications   Prior to Admission medications   Medication Sig Start Date End Date Taking? Authorizing Provider  albuterol (PROVENTIL HFA;VENTOLIN HFA) 108 (90 BASE) MCG/ACT inhaler Inhale 2 puffs into the lungs every 6 (six) hours as needed for wheezing or shortness of breath.    Historical Provider, MD  Aspirin-Acetaminophen-Caffeine (GOODY HEADACHE PO) Take 1 packet by mouth daily as needed (pain).    Historical Provider, MD  HYDROcodone-acetaminophen (NORCO/VICODIN) 5-325 MG per tablet Take 2 tablets by mouth every 4 (four) hours as needed. 11/18/14   Florencia Reasons, MD  neomycin-bacitracin-polymyxin (NEOSPORIN) OINT Apply 1 application topically 3 (three) times daily as needed for wound care.    Historical Provider, MD  ondansetron (ZOFRAN) 4 MG tablet Take 1 tablet (4 mg total) by mouth every 6 (six) hours. Patient not taking: Reported on 11/15/2014 11/12/14   Gareth Morgan, MD  sulfamethoxazole-trimethoprim (BACTRIM DS,SEPTRA DS) 800-160 MG per tablet Take 1 tablet by mouth 2 (two) times daily. 11/18/14   Florencia Reasons, MD   BP 142/78 mmHg  Pulse 77  Temp(Src) 98.5 F (36.9 C) (Oral)  Resp 18  Ht 5\' 2"  (1.575 m)  Wt 165 lb (74.844 kg)  BMI 30.17 kg/m2  SpO2 100% Physical Exam  Constitutional: She is oriented to person, place, and time. She appears well-developed and well-nourished.  HENT:  Head: Normocephalic and atraumatic.  Right Ear: External ear normal.  Left Ear: External ear normal.  Nose: Nose normal.  Mouth/Throat: Oropharynx is clear and moist.  Eyes: Conjunctivae and EOM are normal. Pupils are equal, round, and reactive to light.  Neck: Normal range of motion. Neck supple. No JVD present. No tracheal deviation present. No thyromegaly present.  Cardiovascular: Normal rate, regular rhythm, normal heart sounds and intact distal pulses.   Pulmonary/Chest: Effort normal. She has wheezes.  Few expiratory  wheezes with good air movement and no rhonchi or rales noted  Abdominal: Soft. Bowel sounds are normal. She exhibits no mass. There is no tenderness. There is no guarding.  Musculoskeletal: Normal range of motion.  Lymphadenopathy:    She has no cervical adenopathy.  Neurological: She is alert and oriented to person, place, and time. She has normal reflexes. No cranial nerve deficit or sensory deficit. Gait normal. GCS eye subscore is 4. GCS verbal subscore is 5. GCS motor subscore is 6.  Reflex Scores:      Bicep reflexes are 2+ on the right side and 2+ on the left side.      Patellar reflexes are 2+ on the right side and 2+ on the left side. Strength is normal and equal throughout. Cranial nerves grossly intact. Patient fluent. No gross ataxia and patient able to ambulate without difficulty.  Skin: Skin is warm and dry.  Psychiatric: She has a normal mood and affect. Her behavior is normal. Judgment and thought content normal.  Nursing note and vitals reviewed.   ED Course  Procedures  DIAGNOSTIC STUDIES: Oxygen Saturation is 100% on RA, normal by my interpretation.    COORDINATION OF CARE: 6:53 PM - Discussed plans to order an albuterol inhaler. Pt advised of plan for treatment and pt agrees.  Labs Review Labs Reviewed - No data to display  Imaging Review Dg Chest 2 View  05/02/2015  CLINICAL DATA:  Patient with shortness of breath. History of bronchitis. EXAM: CHEST  2 VIEW COMPARISON:  None. FINDINGS: Normal cardiac and mediastinal contours. No consolidative pulmonary opacities. No pleural effusion or pneumothorax. Regional skeleton is unremarkable. IMPRESSION: No active cardiopulmonary disease. Electronically Signed   By: Lovey Newcomer M.D.   On: 05/02/2015 17:27   I have personally reviewed and evaluated these images and lab results as part of my medical decision-making.   MDM   Final diagnoses:  Chronic obstructive pulmonary disease, unspecified COPD type ([place])    I  personally performed the services described in this documentation, which was scribed in my presence. The recorded information has been reviewed and considered.   Pattricia Boss, MD 05/02/15 2240

## 2015-05-02 NOTE — ED Notes (Signed)
Visitor to desk asking about wait time. Delay explained. Pt states she doesn't "feel good" and wants to leave. Encouraged pt to stay for evaluation

## 2015-05-02 NOTE — ED Notes (Signed)
Sob since yesterday. Denies cough. States she has a hx of bronchitis. Wheezing. She ran out of her inhaler 2 weeks ago.

## 2015-05-02 NOTE — Discharge Instructions (Signed)
Chronic Obstructive Pulmonary Disease Chronic obstructive pulmonary disease (COPD) is a common lung condition in which airflow from the lungs is limited. COPD is a general term that can be used to describe many different lung problems that limit airflow, including both chronic bronchitis and emphysema. If you have COPD, your lung function will probably never return to normal, but there are measures you can take to improve lung function and make yourself feel better. CAUSES   Smoking (common).  Exposure to secondhand smoke.  Genetic problems.  Chronic inflammatory lung diseases or recurrent infections. SYMPTOMS  Shortness of breath, especially with physical activity.  Deep, persistent (chronic) cough with a large amount of thick mucus.  Wheezing.  Rapid breaths (tachypnea).  Gray or bluish discoloration (cyanosis) of the skin, especially in your fingers, toes, or lips.  Fatigue.  Weight loss.  Frequent infections or episodes when breathing symptoms become much worse (exacerbations).  Chest tightness. DIAGNOSIS Your health care provider will take a medical history and perform a physical examination to diagnose COPD. Additional tests for COPD may include:  Lung (pulmonary) function tests.  Chest X-Drue Camera.  CT scan.  Blood tests. TREATMENT  Treatment for COPD may include:  Inhaler and nebulizer medicines. These help manage the symptoms of COPD and make your breathing more comfortable.  Supplemental oxygen. Supplemental oxygen is only helpful if you have a low oxygen level in your blood.  Exercise and physical activity. These are beneficial for nearly all people with COPD.  Lung surgery or transplant.  Nutrition therapy to gain weight, if you are underweight.  Pulmonary rehabilitation. This may involve working with a team of health care providers and specialists, such as respiratory, occupational, and physical therapists. HOME CARE INSTRUCTIONS  Take all medicines  (inhaled or pills) as directed by your health care provider.  Avoid over-the-counter medicines or cough syrups that dry up your airway (such as antihistamines) and slow down the elimination of secretions unless instructed otherwise by your health care provider.  If you are a smoker, the most important thing that you can do is stop smoking. Continuing to smoke will cause further lung damage and breathing trouble. Ask your health care provider for help with quitting smoking. He or she can direct you to community resources or hospitals that provide support.  Avoid exposure to irritants such as smoke, chemicals, and fumes that aggravate your breathing.  Use oxygen therapy and pulmonary rehabilitation if directed by your health care provider. If you require home oxygen therapy, ask your health care provider whether you should purchase a pulse oximeter to measure your oxygen level at home.  Avoid contact with individuals who have a contagious illness.  Avoid extreme temperature and humidity changes.  Eat healthy foods. Eating smaller, more frequent meals and resting before meals may help you maintain your strength.  Stay active, but balance activity with periods of rest. Exercise and physical activity will help you maintain your ability to do things you want to do.  Preventing infection and hospitalization is very important when you have COPD. Make sure to receive all the vaccines your health care provider recommends, especially the pneumococcal and influenza vaccines. Ask your health care provider whether you need a pneumonia vaccine.  Learn and use relaxation techniques to manage stress.  Learn and use controlled breathing techniques as directed by your health care provider. Controlled breathing techniques include:  Pursed lip breathing. Start by breathing in (inhaling) through your nose for 1 second. Then, purse your lips as if you were   going to whistle and breathe out (exhale) through the  pursed lips for 2 seconds.  Diaphragmatic breathing. Start by putting one hand on your abdomen just above your waist. Inhale slowly through your nose. The hand on your abdomen should move out. Then purse your lips and exhale slowly. You should be able to feel the hand on your abdomen moving in as you exhale.  Learn and use controlled coughing to clear mucus from your lungs. Controlled coughing is a series of short, progressive coughs. The steps of controlled coughing are: 1. Lean your head slightly forward. 2. Breathe in deeply using diaphragmatic breathing. 3. Try to hold your breath for 3 seconds. 4. Keep your mouth slightly open while coughing twice. 5. Spit any mucus out into a tissue. 6. Rest and repeat the steps once or twice as needed. SEEK MEDICAL CARE IF:  You are coughing up more mucus than usual.  There is a change in the color or thickness of your mucus.  Your breathing is more labored than usual.  Your breathing is faster than usual. SEEK IMMEDIATE MEDICAL CARE IF:  You have shortness of breath while you are resting.  You have shortness of breath that prevents you from:  Being able to talk.  Performing your usual physical activities.  You have chest pain lasting longer than 5 minutes.  Your skin color is more cyanotic than usual.  You measure low oxygen saturations for longer than 5 minutes with a pulse oximeter. MAKE SURE YOU:  Understand these instructions.  Will watch your condition.  Will get help right away if you are not doing well or get worse.   This information is not intended to replace advice given to you by your health care provider. Make sure you discuss any questions you have with your health care provider.   Document Released: 01/24/2005 Document Revised: 05/07/2014 Document Reviewed: 12/11/2012 Elsevier Interactive Patient Education 2016 Elsevier Inc.  

## 2015-05-02 NOTE — ED Notes (Signed)
Pt talking on cell phone in lobby. Full sentences, no distress

## 2015-06-17 ENCOUNTER — Emergency Department (HOSPITAL_BASED_OUTPATIENT_CLINIC_OR_DEPARTMENT_OTHER): Payer: Self-pay

## 2015-06-17 ENCOUNTER — Encounter (HOSPITAL_BASED_OUTPATIENT_CLINIC_OR_DEPARTMENT_OTHER): Payer: Self-pay | Admitting: *Deleted

## 2015-06-17 ENCOUNTER — Emergency Department (HOSPITAL_BASED_OUTPATIENT_CLINIC_OR_DEPARTMENT_OTHER)
Admission: EM | Admit: 2015-06-17 | Discharge: 2015-06-17 | Disposition: A | Discharge status: Home / Self Care | Payer: Self-pay | Attending: Emergency Medicine | Admitting: Emergency Medicine

## 2015-06-17 DIAGNOSIS — Z7982 Long term (current) use of aspirin: Secondary | ICD-10-CM | POA: Insufficient documentation

## 2015-06-17 DIAGNOSIS — F17210 Nicotine dependence, cigarettes, uncomplicated: Secondary | ICD-10-CM | POA: Insufficient documentation

## 2015-06-17 DIAGNOSIS — R1032 Left lower quadrant pain: Secondary | ICD-10-CM

## 2015-06-17 DIAGNOSIS — N390 Urinary tract infection, site not specified: Secondary | ICD-10-CM | POA: Insufficient documentation

## 2015-06-17 DIAGNOSIS — Z792 Long term (current) use of antibiotics: Secondary | ICD-10-CM | POA: Insufficient documentation

## 2015-06-17 DIAGNOSIS — Z8673 Personal history of transient ischemic attack (TIA), and cerebral infarction without residual deficits: Secondary | ICD-10-CM | POA: Insufficient documentation

## 2015-06-17 DIAGNOSIS — Z79899 Other long term (current) drug therapy: Secondary | ICD-10-CM | POA: Insufficient documentation

## 2015-06-17 DIAGNOSIS — Z8541 Personal history of malignant neoplasm of cervix uteri: Secondary | ICD-10-CM | POA: Insufficient documentation

## 2015-06-17 DIAGNOSIS — R112 Nausea with vomiting, unspecified: Secondary | ICD-10-CM | POA: Insufficient documentation

## 2015-06-17 DIAGNOSIS — Z872 Personal history of diseases of the skin and subcutaneous tissue: Secondary | ICD-10-CM | POA: Insufficient documentation

## 2015-06-17 DIAGNOSIS — J441 Chronic obstructive pulmonary disease with (acute) exacerbation: Secondary | ICD-10-CM | POA: Insufficient documentation

## 2015-06-17 DIAGNOSIS — Z9851 Tubal ligation status: Secondary | ICD-10-CM | POA: Insufficient documentation

## 2015-06-17 DIAGNOSIS — Z85820 Personal history of malignant melanoma of skin: Secondary | ICD-10-CM | POA: Insufficient documentation

## 2015-06-17 DIAGNOSIS — K5900 Constipation, unspecified: Secondary | ICD-10-CM | POA: Insufficient documentation

## 2015-06-17 HISTORY — DX: Chronic obstructive pulmonary disease, unspecified: J44.9

## 2015-06-17 LAB — COMPREHENSIVE METABOLIC PANEL
ALT: 36 U/L (ref 14–54)
AST: 36 U/L (ref 15–41)
Albumin: 4 g/dL (ref 3.5–5.0)
Alkaline Phosphatase: 64 U/L (ref 38–126)
Anion gap: 11 (ref 5–15)
BUN: 17 mg/dL (ref 6–20)
CHLORIDE: 108 mmol/L (ref 101–111)
CO2: 21 mmol/L — AB (ref 22–32)
Calcium: 8.5 mg/dL — ABNORMAL LOW (ref 8.9–10.3)
Creatinine, Ser: 1.29 mg/dL — ABNORMAL HIGH (ref 0.44–1.00)
GFR calc Af Amer: 54 mL/min — ABNORMAL LOW (ref >60)
GFR, EST NON AFRICAN AMERICAN: 47 mL/min — AB (ref >60)
Glucose, Bld: 110 mg/dL — ABNORMAL HIGH (ref 65–99)
Potassium: 3.6 mmol/L (ref 3.5–5.1)
SODIUM: 140 mmol/L (ref 135–145)
Total Bilirubin: 0.5 mg/dL (ref 0.3–1.2)
Total Protein: 7.2 g/dL (ref 6.5–8.1)

## 2015-06-17 LAB — URINALYSIS, ROUTINE W REFLEX MICROSCOPIC
GLUCOSE, UA: NEGATIVE mg/dL
Hgb urine dipstick: NEGATIVE
Ketones, ur: NEGATIVE mg/dL
Leukocytes, UA: NEGATIVE
Nitrite: NEGATIVE
PH: 5.5 (ref 5.0–8.0)
Protein, ur: 30 mg/dL — AB
Specific Gravity, Urine: 1.038 — ABNORMAL HIGH (ref 1.005–1.030)

## 2015-06-17 LAB — URINE MICROSCOPIC-ADD ON: WBC, UA: NONE SEEN WBC/hpf (ref 0–5)

## 2015-06-17 LAB — CBC
HEMATOCRIT: 49.4 % — AB (ref 36.0–46.0)
Hemoglobin: 16.6 g/dL — ABNORMAL HIGH (ref 12.0–15.0)
MCH: 31.8 pg (ref 26.0–34.0)
MCHC: 33.6 g/dL (ref 30.0–36.0)
MCV: 94.6 fL (ref 78.0–100.0)
Platelets: 176 10*3/uL (ref 150–400)
RBC: 5.22 MIL/uL — AB (ref 3.87–5.11)
RDW: 13 % (ref 11.5–15.5)
WBC: 4 10*3/uL (ref 4.0–10.5)

## 2015-06-17 LAB — LIPASE, BLOOD: LIPASE: 25 U/L (ref 11–51)

## 2015-06-17 MED ORDER — ONDANSETRON 4 MG PO TBDP: 4.0000 mg | ORAL_TABLET | Freq: Three times a day (TID) | ORAL | Status: DC | PRN

## 2015-06-17 MED ORDER — IPRATROPIUM-ALBUTEROL 0.5-2.5 (3) MG/3ML IN SOLN
3.0000 mL | Freq: Once | RESPIRATORY_TRACT | Status: AC
  Administered 2015-06-17: 3 mL via RESPIRATORY_TRACT
  Filled 2015-06-17: qty 3

## 2015-06-17 MED ORDER — SODIUM CHLORIDE 0.9 % IV BOLUS (SEPSIS)
1000.0000 mL | Freq: Once | INTRAVENOUS | Status: AC
  Administered 2015-06-17: 1000 mL via INTRAVENOUS

## 2015-06-17 MED ORDER — OMEPRAZOLE 20 MG PO CPDR: 20.0000 mg | DELAYED_RELEASE_CAPSULE | Freq: Every day | ORAL | Status: DC

## 2015-06-17 MED ORDER — CEPHALEXIN 500 MG PO CAPS: 500.0000 mg | ORAL_CAPSULE | Freq: Two times a day (BID) | ORAL | Status: DC

## 2015-06-17 MED ORDER — SODIUM CHLORIDE 0.9 % IV BOLUS (SEPSIS)
500.0000 mL | Freq: Once | INTRAVENOUS | Status: AC
  Administered 2015-06-17: 500 mL via INTRAVENOUS

## 2015-06-17 MED ORDER — MORPHINE SULFATE (PF) 4 MG/ML IV SOLN
4.0000 mg | Freq: Once | INTRAVENOUS | Status: AC
  Administered 2015-06-17: 4 mg via INTRAVENOUS
  Filled 2015-06-17: qty 1

## 2015-06-17 MED ORDER — IOHEXOL 300 MG/ML  SOLN
100.0000 mL | Freq: Once | INTRAMUSCULAR | Status: AC | PRN
  Administered 2015-06-17: 100 mL via INTRAVENOUS

## 2015-06-17 MED ORDER — IOHEXOL 300 MG/ML  SOLN
25.0000 mL | Freq: Once | INTRAMUSCULAR | Status: AC | PRN
Start: 2015-06-17 — End: 2015-06-17
  Administered 2015-06-17: 25 mL via ORAL

## 2015-06-17 MED ORDER — ONDANSETRON HCL 4 MG/2ML IJ SOLN
4.0000 mg | Freq: Once | INTRAMUSCULAR | Status: AC
  Administered 2015-06-17: 4 mg via INTRAVENOUS
  Filled 2015-06-17: qty 2

## 2015-06-17 NOTE — ED Notes (Signed)
Reports "kidney pain" x 2 weeks. Constipation x 4 days. C/o generalized abdominal pain since last night, worse RUQ and LUQ. Reports n/v last time she ate. Reports her throat feels "like it's closing off" x 4 days. Pt hyperventilating in triage

## 2015-06-17 NOTE — Discharge Instructions (Signed)
Abdominal Pain, Adult °Many things can cause abdominal pain. Usually, abdominal pain is not caused by a disease and will improve without treatment. It can often be observed and treated at home. Your health care provider will do a physical exam and possibly order blood tests and X-rays to help determine the seriousness of your pain. However, in many cases, more time must pass before a clear cause of the pain can be found. Before that point, your health care provider may not know if you need more testing or further treatment. °HOME CARE INSTRUCTIONS °Monitor your abdominal pain for any changes. The following actions may help to alleviate any discomfort you are experiencing: °· Only take over-the-counter or prescription medicines as directed by your health care provider. °· Do not take laxatives unless directed to do so by your health care provider. °· Try a clear liquid diet (broth, tea, or water) as directed by your health care provider. Slowly move to a bland diet as tolerated. °SEEK MEDICAL CARE IF: °· You have unexplained abdominal pain. °· You have abdominal pain associated with nausea or diarrhea. °· You have pain when you urinate or have a bowel movement. °· You experience abdominal pain that wakes you in the night. °· You have abdominal pain that is worsened or improved by eating food. °· You have abdominal pain that is worsened with eating fatty foods. °· You have a fever. °SEEK IMMEDIATE MEDICAL CARE IF: °· Your pain does not go away within 2 hours. °· You keep throwing up (vomiting). °· Your pain is felt only in portions of the abdomen, such as the right side or the left lower portion of the abdomen. °· You pass bloody or black tarry stools. °MAKE SURE YOU: °· Understand these instructions. °· Will watch your condition. °· Will get help right away if you are not doing well or get worse. °  °This information is not intended to replace advice given to you by your health care provider. Make sure you discuss  any questions you have with your health care provider. °  °Document Released: 01/24/2005 Document Revised: 01/05/2015 Document Reviewed: 12/24/2012 °Elsevier Interactive Patient Education ©2016 Elsevier Inc. °Urinary Tract Infection °Urinary tract infections (UTIs) can develop anywhere along your urinary tract. Your urinary tract is your body's drainage system for removing wastes and extra water. Your urinary tract includes two kidneys, two ureters, a bladder, and a urethra. Your kidneys are a pair of bean-shaped organs. Each kidney is about the size of your fist. They are located below your ribs, one on each side of your spine. °CAUSES °Infections are caused by microbes, which are microscopic organisms, including fungi, viruses, and bacteria. These organisms are so small that they can only be seen through a microscope. Bacteria are the microbes that most commonly cause UTIs. °SYMPTOMS  °Symptoms of UTIs may vary by age and gender of the patient and by the location of the infection. Symptoms in young women typically include a frequent and intense urge to urinate and a painful, burning feeling in the bladder or urethra during urination. Older women and men are more likely to be tired, shaky, and weak and have muscle aches and abdominal pain. A fever may mean the infection is in your kidneys. Other symptoms of a kidney infection include pain in your back or sides below the ribs, nausea, and vomiting. °DIAGNOSIS °To diagnose a UTI, your caregiver will ask you about your symptoms. Your caregiver will also ask you to provide a urine sample. The   urine sample will be tested for bacteria and white blood cells. White blood cells are made by your body to help fight infection. TREATMENT  Typically, UTIs can be treated with medication. Because most UTIs are caused by a bacterial infection, they usually can be treated with the use of antibiotics. The choice of antibiotic and length of treatment depend on your symptoms and the  type of bacteria causing your infection. HOME CARE INSTRUCTIONS  If you were prescribed antibiotics, take them exactly as your caregiver instructs you. Finish the medication even if you feel better after you have only taken some of the medication.  Drink enough water and fluids to keep your urine clear or pale yellow.  Avoid caffeine, tea, and carbonated beverages. They tend to irritate your bladder.  Empty your bladder often. Avoid holding urine for long periods of time.  Empty your bladder before and after sexual intercourse.  After a bowel movement, women should cleanse from front to back. Use each tissue only once. SEEK MEDICAL CARE IF:   You have back pain.  You develop a fever.  Your symptoms do not begin to resolve within 3 days. SEEK IMMEDIATE MEDICAL CARE IF:   You have severe back pain or lower abdominal pain.  You develop chills.  You have nausea or vomiting.  You have continued burning or discomfort with urination. MAKE SURE YOU:   Understand these instructions.  Will watch your condition.  Will get help right away if you are not doing well or get worse.   This information is not intended to replace advice given to you by your health care provider. Make sure you discuss any questions you have with your health care provider.   Document Released: 01/24/2005 Document Revised: 01/05/2015 Document Reviewed: 05/25/2011 Elsevier Interactive Patient Education 2016 Elsevier Inc. Nausea and Vomiting Nausea is a sick feeling that often comes before throwing up (vomiting). Vomiting is a reflex where stomach contents come out of your mouth. Vomiting can cause severe loss of body fluids (dehydration). Children and elderly adults can become dehydrated quickly, especially if they also have diarrhea. Nausea and vomiting are symptoms of a condition or disease. It is important to find the cause of your symptoms. CAUSES   Direct irritation of the stomach lining. This irritation  can result from increased acid production (gastroesophageal reflux disease), infection, food poisoning, taking certain medicines (such as nonsteroidal anti-inflammatory drugs), alcohol use, or tobacco use.  Signals from the brain.These signals could be caused by a headache, heat exposure, an inner ear disturbance, increased pressure in the brain from injury, infection, a tumor, or a concussion, pain, emotional stimulus, or metabolic problems.  An obstruction in the gastrointestinal tract (bowel obstruction).  Illnesses such as diabetes, hepatitis, gallbladder problems, appendicitis, kidney problems, cancer, sepsis, atypical symptoms of a heart attack, or eating disorders.  Medical treatments such as chemotherapy and radiation.  Receiving medicine that makes you sleep (general anesthetic) during surgery. DIAGNOSIS Your caregiver may ask for tests to be done if the problems do not improve after a few days. Tests may also be done if symptoms are severe or if the reason for the nausea and vomiting is not clear. Tests may include:  Urine tests.  Blood tests.  Stool tests.  Cultures (to look for evidence of infection).  X-rays or other imaging studies. Test results can help your caregiver make decisions about treatment or the need for additional tests. TREATMENT You need to stay well hydrated. Drink frequently but in small amounts.You  may wish to drink water, sports drinks, clear broth, or eat frozen ice pops or gelatin dessert to help stay hydrated.When you eat, eating slowly may help prevent nausea.There are also some antinausea medicines that may help prevent nausea. HOME CARE INSTRUCTIONS   Take all medicine as directed by your caregiver.  If you do not have an appetite, do not force yourself to eat. However, you must continue to drink fluids.  If you have an appetite, eat a normal diet unless your caregiver tells you differently.  Eat a variety of complex carbohydrates (rice,  wheat, potatoes, bread), lean meats, yogurt, fruits, and vegetables.  Avoid high-fat foods because they are more difficult to digest.  Drink enough water and fluids to keep your urine clear or pale yellow.  If you are dehydrated, ask your caregiver for specific rehydration instructions. Signs of dehydration may include:  Severe thirst.  Dry lips and mouth.  Dizziness.  Dark urine.  Decreasing urine frequency and amount.  Confusion.  Rapid breathing or pulse. SEEK IMMEDIATE MEDICAL CARE IF:   You have blood or brown flecks (like coffee grounds) in your vomit.  You have black or bloody stools.  You have a severe headache or stiff neck.  You are confused.  You have severe abdominal pain.  You have chest pain or trouble breathing.  You do not urinate at least once every 8 hours.  You develop cold or clammy skin.  You continue to vomit for longer than 24 to 48 hours.  You have a fever. MAKE SURE YOU:   Understand these instructions.  Will watch your condition.  Will get help right away if you are not doing well or get worse.   This information is not intended to replace advice given to you by your health care provider. Make sure you discuss any questions you have with your health care provider.   Document Released: 04/16/2005 Document Revised: 07/09/2011 Document Reviewed: 09/13/2010 Elsevier Interactive Patient Education [employer].

## 2015-06-17 NOTE — ED Provider Notes (Signed)
CSN: [id number]     Arrival date & time 06/17/15  1016 History   First MD Initiated Contact with Patient 06/17/15 1100     Chief Complaint  Patient presents with  . Abdominal Pain    Breanna Sullivan is a 55 y.o. female who presents to the emergency department complaining of left flank pain, left lower quadrant abdominal pain, nausea and vomiting for the past 2 weeks. She reports she vomits almost every time she ate. She reports vomiting approximately 1 hour after consuming food. She also reports some urinary urgency and frequency for the past several days. She also reports feeling constipated. Her last bowel movement was 4 days ago and she has been passing gas. She currently complains of 7 out of 10 left sided abdominal and left flank pain.  She has taken nothing for treatment today. Previous abdominal surgical history includes tubal ligation. She reports subjective fevers at night. She also complains of cough starting yesterday with some wheezing. She has a history of COPD. Patient denies dysuria, hematuria, hematemesis, hematochezia, chest pain, palpitations, trouble swallowing, neck stiffness, syncope, or lightheadedness.   Patient is a 55 y.o. female presenting with abdominal pain. The history is provided by the patient. No language interpreter was used.  Abdominal Pain Associated symptoms: constipation, cough, fever (subjective), nausea, shortness of breath and vomiting   Associated symptoms: no chest pain, no chills, no diarrhea, no dysuria, no vaginal bleeding and no vaginal discharge     Past Medical History  Diagnosis Date  . History of hiatal hernia   . Cellulitis of right lower leg     "I&D in ER on 11/12/2014; admitted 11/15/2014"  . Asthma with bronchitis   . TIA (transient ischemic attack) ~ 2011    "mild", denies residual on 11/15/2014  . Melanoma of back ([place])   . Cervical cancer ([place])     "they froze it"  . COPD (chronic obstructive pulmonary disease) [facility])    Past Surgical  History  Procedure Laterality Date  . Melanoma excision  2011    "back"  . Tubal ligation  1990  . Gynecologic cryosurgery      "for cervical cancer"   No family history on file. Social History  Substance Use Topics  . Smoking status: Current Every Day Smoker -- 0.12 packs/day for 34 years    Types: Cigarettes  . Smokeless tobacco: Never Used  . Alcohol Use: No   OB History    No data available     Review of Systems  Constitutional: Positive for fever (subjective). Negative for chills.  HENT: Negative for congestion and trouble swallowing.   Eyes: Negative for visual disturbance.  Respiratory: Positive for cough, shortness of breath and wheezing.   Cardiovascular: Negative for chest pain.  Gastrointestinal: Positive for nausea, vomiting, abdominal pain and constipation. Negative for diarrhea and blood in stool.  Genitourinary: Positive for urgency, frequency, flank pain and decreased urine volume. Negative for dysuria, vaginal bleeding, vaginal discharge and difficulty urinating.  Musculoskeletal: Negative for back pain and neck pain.  Skin: Negative for rash.  Neurological: Negative for syncope, light-headedness and headaches.      Allergies  Asa buff (mag; Chocolate; and Compazine  Home Medications   Prior to Admission medications   Medication Sig Start Date End Date Taking? Authorizing Provider  albuterol (PROVENTIL HFA;VENTOLIN HFA) 108 (90 BASE) MCG/ACT inhaler Inhale 2 puffs into the lungs every 6 (six) hours as needed for wheezing or shortness of breath.   Yes Historical Provider,  MD  Aspirin-Acetaminophen-Caffeine (GOODY HEADACHE PO) Take 1 packet by mouth daily as needed (pain).    Historical Provider, MD  cephALEXin (KEFLEX) 500 MG capsule Take 1 capsule (500 mg total) by mouth 2 (two) times daily. 06/17/15   Waynetta Pean, PA-C  HYDROcodone-acetaminophen (NORCO/VICODIN) 5-325 MG per tablet Take 2 tablets by mouth every 4 (four) hours as needed. 11/18/14   Florencia Reasons, MD  neomycin-bacitracin-polymyxin (NEOSPORIN) OINT Apply 1 application topically 3 (three) times daily as needed for wound care.    Historical Provider, MD  omeprazole (PRILOSEC) 20 MG capsule Take 1 capsule (20 mg total) by mouth daily. 06/17/15   Waynetta Pean, PA-C  ondansetron (ZOFRAN ODT) 4 MG disintegrating tablet Take 1 tablet (4 mg total) by mouth every 8 (eight) hours as needed for nausea or vomiting. 06/17/15   Waynetta Pean, PA-C  ondansetron (ZOFRAN) 4 MG tablet Take 1 tablet (4 mg total) by mouth every 6 (six) hours. Patient not taking: Reported on 11/15/2014 11/12/14   Gareth Morgan, MD  sulfamethoxazole-trimethoprim (BACTRIM DS,SEPTRA DS) 800-160 MG per tablet Take 1 tablet by mouth 2 (two) times daily. 11/18/14   Florencia Reasons, MD   BP 104/74 mmHg  Pulse 75  Temp(Src) 99.2 F (37.3 C) (Oral)  Resp 20  Ht 5\' 2"  (1.575 m)  Wt 84.823 kg  BMI 34.19 kg/m2  SpO2 96% Physical Exam  Constitutional: She appears well-developed and well-nourished. No distress.  Nontoxic appearing.  HENT:  Head: Normocephalic and atraumatic.  Right Ear: External ear normal.  Left Ear: External ear normal.  Mouth/Throat: Oropharynx is clear and moist.  There is clear. No tonsillar hypertrophy or exudates. No posterior oropharyngeal erythema or edema.  Eyes: Conjunctivae are normal. Pupils are equal, round, and reactive to light. Right eye exhibits no discharge. Left eye exhibits no discharge.  Neck: Normal range of motion. Neck supple. No JVD present. No tracheal deviation present.  Cardiovascular: Normal rate, regular rhythm, normal heart sounds and intact distal pulses.  Exam reveals no gallop and no friction rub.   No murmur heard. Heart rate is 88.  Pulmonary/Chest: Effort normal. No respiratory distress. She has wheezes. She has no rales.  Patient has scattered wheezes noted bilaterally. Symmetric chest expansion bilaterally. Oxygen saturation 100% on room air.  Abdominal: Soft. Bowel sounds are  normal. She exhibits no distension and no mass. There is tenderness. There is guarding. There is no rebound.  Abdomen is soft. Bowel sounds are present. Patient has moderate suprapubic, epigastric, left lower quadrant, left upper quadrant and left flank tenderness to palpation. No psoas or obturator sign.  Musculoskeletal: She exhibits no edema.  Lymphadenopathy:    She has no cervical adenopathy.  Neurological: She is alert. Coordination normal.  Skin: Skin is warm and dry. No rash noted. She is not diaphoretic. No erythema. No pallor.  Psychiatric: She has a normal mood and affect. Her behavior is normal.  Nursing note and vitals reviewed.   ED Course  Procedures (including critical care time) Labs Review Labs Reviewed  COMPREHENSIVE METABOLIC PANEL - Abnormal; Notable for the following:    CO2 21 (*)    Glucose, Bld 110 (*)    Creatinine, Ser 1.29 (*)    Calcium 8.5 (*)    GFR calc non Af Amer 47 (*)    GFR calc Af Amer 54 (*)    All other components within normal limits  CBC - Abnormal; Notable for the following:    RBC 5.22 (*)  Hemoglobin 16.6 (*)    HCT 49.4 (*)    All other components within normal limits  URINALYSIS, ROUTINE W REFLEX MICROSCOPIC (NOT AT [facility]) - Abnormal; Notable for the following:    Color, Urine AMBER (*)    Specific Gravity, Urine 1.038 (*)    Bilirubin Urine SMALL (*)    Protein, ur 30 (*)    All other components within normal limits  URINE MICROSCOPIC-ADD ON - Abnormal; Notable for the following:    Squamous Epithelial / LPF 0-5 (*)    Bacteria, UA MANY (*)    Casts HYALINE CASTS (*)    All other components within normal limits  URINE CULTURE  LIPASE, BLOOD    Imaging Review Dg Chest 2 View  06/17/2015  CLINICAL DATA:  Cough and shortness of breath EXAM: CHEST  2 VIEW COMPARISON:  05/02/2015 FINDINGS: Normal heart size and mediastinal contours. No acute infiltrate or edema. No effusion or pneumothorax. No acute osseous findings. IMPRESSION:  No active cardiopulmonary disease. Electronically Signed   By: Monte Fantasia M.D.   On: 06/17/2015 13:14   US Transvaginal Non-ob  06/17/2015  CLINICAL DATA:  Left lower quadrant pain for 4-5 months. Pain is sporadic. Possible dermoid cyst on CT. EXAM: TRANSABDOMINAL AND TRANSVAGINAL ULTRASOUND OF PELVIS TECHNIQUE: Both transabdominal and transvaginal ultrasound examinations of the pelvis were performed. Transabdominal technique was performed for global imaging of the pelvis including uterus, ovaries, adnexal regions, and pelvic cul-de-sac. It was necessary to proceed with endovaginal exam following the transabdominal exam to visualize the uterus and adnexa. COMPARISON:  CT 06/17/2015 FINDINGS: Uterus Measurements: 7.3 x 3.5 x 5.1 cm. Nabothian cyst in the cervix. There is a heterogeneous structure along the posterior aspect of the uterus that measures 2.6 x 1.9 x 2.5 cm. This is suggestive for a fibroid. In addition, there is a hyperechoic structure along the left side of the uterus which shadowing. It is possible that this represents the fat containing structure seen on the recent CT but this may also be related to adjacent bowel. 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 Endometrium Thickness: 0.3 cm. Small amount of fluid within the endometrial cavity at the fundus. Right ovary Not visualized. Left ovary Not visualized. Other findings None IMPRESSION: Evidence for uterine fibroids. 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 Non visualization of the adnexa and ovaries on this examination. Electronically Signed   By: Markus Daft M.D.   On: 06/17/2015 16:25   US Pelvis Complete  06/17/2015  CLINICAL DATA:  Left lower quadrant pain for 4-5 months. Pain is sporadic. Possible dermoid cyst on CT. EXAM: TRANSABDOMINAL AND TRANSVAGINAL ULTRASOUND OF PELVIS TECHNIQUE: Both transabdominal and  transvaginal ultrasound examinations of the pelvis were performed. Transabdominal technique was performed for global imaging of the pelvis including uterus, ovaries, adnexal regions, and pelvic cul-de-sac. It was necessary to proceed with endovaginal exam following the transabdominal exam to visualize the uterus and adnexa. COMPARISON:  CT 06/17/2015 FINDINGS: Uterus Measurements: 7.3 x 3.5 x 5.1 cm. Nabothian cyst in the cervix. There is a heterogeneous structure along the posterior aspect of the uterus that measures 2.6 x 1.9 x 2.5 cm. This is suggestive for a fibroid. In addition, there is a hyperechoic structure along the left side of the uterus which shadowing. It is possible that this represents the fat containing structure seen on the recent CT but  this may also be related to adjacent bowel. 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 Endometrium Thickness: 0.3 cm. Small amount of fluid within the endometrial cavity at the fundus. Right ovary Not visualized. Left ovary Not visualized. Other findings None IMPRESSION: Evidence for uterine fibroids. 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 Non visualization of the adnexa and ovaries on this examination. Electronically Signed   By: Markus Daft M.D.   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 The mass continues to be stable compared with 04/30/2011. Electronically Signed   By: Kathreen Devoid   On: 06/17/2015 16:22  06/17/2015  CLINICAL DATA:  Left lower quadrant pain, left leg pain EXAM: CT ABDOMEN AND PELVIS WITH CONTRAST TECHNIQUE: Multidetector CT imaging of the abdomen and pelvis was performed  using the standard protocol following bolus administration of intravenous contrast. CONTRAST:  191mL OMNIPAQUE IOHEXOL 300 MG/ML SOLN, 10mL OMNIPAQUE IOHEXOL 300 MG/ML SOLN COMPARISON:  04/30/2011 FINDINGS: Lower chest:  Lung bases are clear.  Normal heart size. Hepatobiliary: Normal liver.  Normal gallbladder. Pancreas: Normal. Spleen: Normal. Adrenals/Urinary Tract: Normal adrenal glands. Normal kidneys. Normal bladder. No urolithiasis or obstructive uropathy. Stomach/Bowel: No bowel wall dilatation. No bowel wall thickening. Diverticulosis of the sigmoid colon without evidence of diverticulitis. Normal appendix. No pneumatosis, pneumoperitoneum portal venous gas. Vascular/Lymphatic: Normal caliber abdominal aorta. No lymphadenopathy. Reproductive: 3.3 cm left adnexal mass with areas of fat attenuation likely reflecting a dermoid. Other: No fluid collection or hematoma. Musculoskeletal: No lytic or sclerotic osseous lesion. No acute osseous abnormality. IMPRESSION: 1. No acute abdominal or pelvic pathology. 2. 3.3 cm left adnexal mass with areas of fat attenuation likely reflecting a dermoid. This is unchanged compared with 04/30/2011. Electronically Signed: By: Kathreen Devoid On: 06/17/2015 13:14   I have personally reviewed and evaluated these images and lab results as part of my medical decision-making.   EKG Interpretation None      Filed Vitals:   06/17/15 1340 06/17/15 1400 06/17/15 1500 06/17/15 1545  BP: 103/57 105/54 102/78 104/74  Pulse: 78 82 85 75  Temp:      TempSrc:      Resp: 20     Height:      Weight:      SpO2: 95% 96% 98% 96%     MDM   Meds given in ED:  Medications  sodium chloride 0.9 % bolus 1,000 mL (0 mLs Intravenous Stopped 06/17/15 1414)  ondansetron (ZOFRAN) injection 4 mg (4 mg Intravenous Given 06/17/15 1221)  morphine 4 MG/ML injection 4 mg (4 mg Intravenous Given 06/17/15 1224)  ipratropium-albuterol (DUONEB) 0.5-2.5 (3) MG/3ML nebulizer solution 3 mL (3 mLs  Nebulization Given 06/17/15 1215)  iohexol (OMNIPAQUE) 300 MG/ML solution 25 mL (25 mLs Oral Contrast Given 06/17/15 1254)  iohexol (OMNIPAQUE) 300 MG/ML solution 100 mL (100 mLs Intravenous Contrast Given 06/17/15 1254)  sodium chloride 0.9 % bolus 500 mL (0 mLs Intravenous Stopped 06/17/15 1450)  sodium chloride 0.9 % bolus 500 mL (500 mLs Intravenous New Bag/Given 06/17/15 1520)  ondansetron (ZOFRAN) injection 4 mg (4 mg Intravenous Given 06/17/15 1520)  morphine 4 MG/ML injection 4 mg (4  mg Intravenous Given 06/17/15 1520)    New Prescriptions   CEPHALEXIN (KEFLEX) 500 MG CAPSULE    Take 1 capsule (500 mg total) by mouth 2 (two) times daily.   OMEPRAZOLE (PRILOSEC) 20 MG CAPSULE    Take 1 capsule (20 mg total) by mouth daily.   ONDANSETRON (ZOFRAN ODT) 4 MG DISINTEGRATING TABLET    Take 1 tablet (4 mg total) by mouth every 8 (eight) hours as needed for nausea or vomiting.    Final diagnoses:  UTI (lower urinary tract infection)  Non-intractable vomiting with nausea, vomiting of unspecified type  LLQ abdominal pain   This is a 55 y.o. female who presents to the emergency department complaining of left flank pain, left lower quadrant abdominal pain, nausea and vomiting for the past 2 weeks. She reports she vomits almost every time she ate. She reports vomiting approximately 1 hour after consuming food. She also reports some urinary urgency and frequency for the past several days. She also reports feeling constipated. Her last bowel movement was 4 days ago and she has been passing gas. She currently complains of 7 out of 10 left sided abdominal and left flank pain.  She has taken nothing for treatment today. Previous abdominal surgical history includes tubal ligation. She reports subjective fevers at night. She also complains of cough starting yesterday with some wheezing. She has a history of COPD. On exam the patient is afebrile and nontoxic appearing. She has slight scattered wheezes noted  bilaterally. No increased work of breathing. No respiratory distress. Patient's abdomen is soft and she has tenderness to her suprapubic abdomen, and LLQ. Throat is clear. CBC is unremarkable. CMP revealed a mildly elevated creatinine of 1.29 with a GFR of 47. This is an improvement from her baseline. Lipase is within normal limits. Urinalysis is nitrite and leukocyte negative. Urine microscopic examination revealed many bacteria. Urine sent for culture. CT abdomen and pelvis with contrast reveals no acute abdominal or pelvic abnormality. 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 CXR is unremarkable.  After discussion of my attending, Dr. Regenia Skeeter, and reevaluation and discussion with the patient, will obtain pelvic ultrasound. Which revealed evidence for uterine fibroids and the lipoleiomyoma. Ovaries are not able to be visualized. After this workup, the patient did have some bacteria in her urine. We'll treat for urinary tract infection and send urine for culture. Patient is tolerating by mouth at reevaluation. She reports she is feeling better. She does report feeling ready to be discharged. We'll discharge with prescriptions for Keflex, omeprazole and ondansetron. I discussed strict and specific return precautions. I advised the patient to follow-up with their primary care provider this week. I advised the patient to return to the emergency department with new or worsening symptoms or new concerns. The patient verbalized understanding and agreement with plan.    This patient was discussed with Dr. Regenia Skeeter who agrees with assessment and plan.    Waynetta Pean, PA-C 06/17/15 1705  Sherwood Gambler, MD 06/20/15 [phone]

## 2015-06-19 LAB — URINE CULTURE

## 2015-07-22 ENCOUNTER — Other Ambulatory Visit: Payer: Self-pay

## 2016-07-24 ENCOUNTER — Emergency Department (HOSPITAL_BASED_OUTPATIENT_CLINIC_OR_DEPARTMENT_OTHER): Payer: Self-pay

## 2016-07-24 ENCOUNTER — Emergency Department (HOSPITAL_BASED_OUTPATIENT_CLINIC_OR_DEPARTMENT_OTHER)
Admission: EM | Admit: 2016-07-24 | Discharge: 2016-07-25 | Disposition: A | Discharge status: Home / Self Care | Payer: Self-pay | Attending: Emergency Medicine | Admitting: Emergency Medicine

## 2016-07-24 ENCOUNTER — Encounter (HOSPITAL_BASED_OUTPATIENT_CLINIC_OR_DEPARTMENT_OTHER): Payer: Self-pay

## 2016-07-24 DIAGNOSIS — J449 Chronic obstructive pulmonary disease, unspecified: Secondary | ICD-10-CM | POA: Insufficient documentation

## 2016-07-24 DIAGNOSIS — Z8541 Personal history of malignant neoplasm of cervix uteri: Secondary | ICD-10-CM | POA: Insufficient documentation

## 2016-07-24 DIAGNOSIS — Z79899 Other long term (current) drug therapy: Secondary | ICD-10-CM | POA: Insufficient documentation

## 2016-07-24 DIAGNOSIS — J45909 Unspecified asthma, uncomplicated: Secondary | ICD-10-CM | POA: Insufficient documentation

## 2016-07-24 DIAGNOSIS — M722 Plantar fascial fibromatosis: Secondary | ICD-10-CM | POA: Insufficient documentation

## 2016-07-24 DIAGNOSIS — F17210 Nicotine dependence, cigarettes, uncomplicated: Secondary | ICD-10-CM | POA: Insufficient documentation

## 2016-07-24 DIAGNOSIS — M7731 Calcaneal spur, right foot: Secondary | ICD-10-CM | POA: Insufficient documentation

## 2016-07-24 NOTE — ED Triage Notes (Signed)
Pt c/o right heel pain and swelling for the last two weeks, no known injury

## 2016-07-25 MED ORDER — IBUPROFEN 800 MG PO TABS: 800.0000 mg | ORAL_TABLET | Freq: Once | ORAL | Status: DC

## 2016-07-25 NOTE — ED Provider Notes (Signed)
TIME SEEN: 12:53 AM  CHIEF COMPLAINT: Right foot pain  HPI: Patient is a 56 year old female with history of hypertension, COPD, TIA who presents to the emergency department with 2 weeks of right foot pain. No known injury. Pain is worse in the morning and worse with walking. She has had some swelling over the insertion of the plantar fascia. No fevers, redness or warmth. No history of gout. No numbness or tingling. She has been ambulatory. States she often wears flip flops.  ROS: See HPI Constitutional: no fever  Eyes: no drainage  ENT: no runny nose   Cardiovascular:  no chest pain  Resp: no SOB  GI: no vomiting GU: no dysuria Integumentary: no rash  Allergy: no hives  Musculoskeletal: no leg swelling  Neurological: no slurred speech ROS otherwise negative  PAST MEDICAL HISTORY/PAST SURGICAL HISTORY:  Past Medical History:  Diagnosis Date  . Asthma with bronchitis   . Cellulitis of right lower leg    "I&D in ER on 11/12/2014; admitted 11/15/2014"  . Cervical cancer ([place])    "they froze it"  . COPD (chronic obstructive pulmonary disease) ([place])   . History of hiatal hernia   . Melanoma of back ([place])   . TIA (transient ischemic attack) ~ 2011   "mild", denies residual on 11/15/2014    MEDICATIONS:  Prior to Admission medications   Medication Sig Start Date End Date Taking? Authorizing Provider  albuterol (PROVENTIL HFA;VENTOLIN HFA) 108 (90 BASE) MCG/ACT inhaler Inhale 2 puffs into the lungs every 6 (six) hours as needed for wheezing or shortness of breath.    Historical Provider, MD  Aspirin-Acetaminophen-Caffeine (GOODY HEADACHE PO) Take 1 packet by mouth daily as needed (pain).    Historical Provider, MD  cephALEXin (KEFLEX) 500 MG capsule Take 1 capsule (500 mg total) by mouth 2 (two) times daily. 06/17/15   Waynetta Pean, PA-C  HYDROcodone-acetaminophen (NORCO/VICODIN) 5-325 MG per tablet Take 2 tablets by mouth every 4 (four) hours as needed. 11/18/14   Florencia Reasons, MD   neomycin-bacitracin-polymyxin (NEOSPORIN) OINT Apply 1 application topically 3 (three) times daily as needed for wound care.    Historical Provider, MD  omeprazole (PRILOSEC) 20 MG capsule Take 1 capsule (20 mg total) by mouth daily. 06/17/15   Waynetta Pean, PA-C  ondansetron (ZOFRAN ODT) 4 MG disintegrating tablet Take 1 tablet (4 mg total) by mouth every 8 (eight) hours as needed for nausea or vomiting. 06/17/15   Waynetta Pean, PA-C  ondansetron (ZOFRAN) 4 MG tablet Take 1 tablet (4 mg total) by mouth every 6 (six) hours. Patient not taking: Reported on 11/15/2014 11/12/14   Gareth Morgan, MD  sulfamethoxazole-trimethoprim (BACTRIM DS,SEPTRA DS) 800-160 MG per tablet Take 1 tablet by mouth 2 (two) times daily. 11/18/14   Florencia Reasons, MD    ALLERGIES:  Allergies  Allergen Reactions  . Asa Buff (Mag [Buffered Aspirin] Nausea Only  . Chocolate Nausea And Vomiting  . Compazine Hives    SOCIAL HISTORY:  Social History  Substance Use Topics  . Smoking status: Current Every Day Smoker    Packs/day: 0.12    Years: 34.00    Types: Cigarettes  . Smokeless tobacco: Never Used  . Alcohol use No    FAMILY HISTORY: No family history on file.  EXAM: BP (!) 197/111 (BP Location: Left Arm) Comment: HX of hypertension per patient  Pulse (!) 116   Temp 98.3 F (36.8 C) (Oral)   Resp 18   Ht 5\' 2"  (1.575 m)  Wt 170 lb (77.1 kg)   SpO2 99%   BMI 31.09 kg/m  CONSTITUTIONAL: Alert and oriented and responds appropriately to questions. Well-appearing; well-nourished HEAD: Normocephalic EYES: Conjunctivae clear, pupils appear equal, EOMI ENT: normal nose; moist mucous membranes NECK: Supple, no meningismus, no nuchal rigidity, no LAD  CARD: RRR; S1 and S2 appreciated; no murmurs, no clicks, no rubs, no gallops RESP: Normal chest excursion without splinting or tachypnea; breath sounds clear and equal bilaterally; no wheezes, no rhonchi, no rales, no hypoxia or respiratory distress, speaking  full sentences ABD/GI: Normal bowel sounds; non-distended; soft, non-tender, no rebound, no guarding, no peritoneal signs, no hepatosplenomegaly BACK:  The back appears normal and is non-tender to palpation, there is no CVA tenderness NOI:BBCWUG to palpation over the insertion of the plantar fascia of the right foot. No significant tenderness over the heel. Small amount of swelling over the plantar fascia insertion. No ecchymosis, erythema or warmth. No joint effusion. No ligamentous laxity of the ankle. No proximal fibular head tenderness. No pain in the right knee or hip. No calf tenderness or swelling. Compartments are all soft. Normal plantar and dorsiflexion. Normal ROM in all joints; otherwise extremities are non-tender to palpation; no edema; normal capillary refill; 2+ right-sided DP pulse, right leg is warm and well perfused with normal sensation SKIN: Normal color for age and race; warm; no rash NEURO: Moves all extremities equally PSYCH: The patient's mood and manner are appropriate. Grooming and personal hygiene are appropriate.  MEDICAL DECISION MAKING: Patient here with what seems to be right plantar fasciitis. She does have a plantar calcaneal spur but is not tender over this area. 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 Nothing to suggest infection on exam. No calf tenderness or swelling to suggest DVT. Extremities warm and well-perfused with normal sensation. No fracture or dislocation seen on x-ray. I recommended that she stop worrying. Flops and wears shoes with better arch support. Recommend alternating Tylenol and ibuprofen for pain. Recommended elevation when at rest for swelling. Recommended she use a plantar fasciitis boot at night to help keep her foot and dorsiflexion to help with her plantar fasciitis. I have also recommended that she use a bottle of warm water to roll underneath her foot several times a  day. We'll give her outpatient podiatry follow-up information. Patient is comfortable with this plan. Discussed return precautions.  At this time, I do not feel there is any life-threatening condition present. I have reviewed and discussed all results (EKG, imaging, lab, urine as appropriate) and exam findings with patient/family. I have reviewed nursing notes and appropriate previous records.  I feel the patient is safe to be discharged home without further emergent workup and can continue workup as an outpatient as needed. Discussed usual and customary return precautions. Patient/family verbalize understanding and are comfortable with this plan.  Outpatient follow-up has been provided if needed. All questions have been answered.      [place], DO 07/25/16 0130

## 2016-07-25 NOTE — Discharge Instructions (Signed)
You may alternate Tylenol 1000 mg every 6 hours as needed for pain and Ibuprofen 800 mg every 8 hours as needed for pain.  Please take Ibuprofen with food. ° °

## 2016-08-27 ENCOUNTER — Encounter (HOSPITAL_COMMUNITY): Payer: Self-pay | Admitting: Emergency Medicine

## 2016-08-27 ENCOUNTER — Emergency Department (HOSPITAL_COMMUNITY): Payer: Self-pay

## 2016-08-27 ENCOUNTER — Emergency Department (HOSPITAL_COMMUNITY)
Admission: EM | Admit: 2016-08-27 | Discharge: 2016-08-27 | Disposition: A | Discharge status: Home / Self Care | Payer: Self-pay | Attending: Emergency Medicine | Admitting: Emergency Medicine

## 2016-08-27 DIAGNOSIS — Z85828 Personal history of other malignant neoplasm of skin: Secondary | ICD-10-CM | POA: Insufficient documentation

## 2016-08-27 DIAGNOSIS — F17210 Nicotine dependence, cigarettes, uncomplicated: Secondary | ICD-10-CM | POA: Insufficient documentation

## 2016-08-27 DIAGNOSIS — Z7982 Long term (current) use of aspirin: Secondary | ICD-10-CM | POA: Insufficient documentation

## 2016-08-27 DIAGNOSIS — B070 Plantar wart: Secondary | ICD-10-CM | POA: Insufficient documentation

## 2016-08-27 DIAGNOSIS — M79671 Pain in right foot: Secondary | ICD-10-CM

## 2016-08-27 DIAGNOSIS — Z8541 Personal history of malignant neoplasm of cervix uteri: Secondary | ICD-10-CM | POA: Insufficient documentation

## 2016-08-27 DIAGNOSIS — J449 Chronic obstructive pulmonary disease, unspecified: Secondary | ICD-10-CM | POA: Insufficient documentation

## 2016-08-27 MED ORDER — TRAMADOL HCL 50 MG PO TABS: 50.0000 mg | ORAL_TABLET | Freq: Four times a day (QID) | ORAL | 0 refills | Status: DC | PRN

## 2016-08-27 MED ORDER — MELOXICAM 15 MG PO TABS: 15.0000 mg | ORAL_TABLET | Freq: Every day | ORAL | 0 refills | Status: DC

## 2016-08-27 NOTE — ED Triage Notes (Signed)
Onset 2-3 months ago developed pain on the bottom of her foot. Seen at another hospital 1 month ago suppose to follow up with Doctor however does not have insurance. Pain continuing the past month intermittently worsening overtime.

## 2016-08-27 NOTE — ED Notes (Signed)
Pt wheeled back to room in wheelchair. Pt assisted to the chair.

## 2016-08-27 NOTE — ED Provider Notes (Signed)
[facility] DEPT Provider Note    By signing my name below, I, Bea Graff, attest that this documentation has been prepared under the direction and in the presence of Margarita Mail, PA-C. Electronically Signed: Bea Graff, ED Scribe. 08/27/16. 4:01 PM.    History   Chief Complaint Chief Complaint  Patient presents with  . Foot Pain   The history is provided by the patient and medical records. No language interpreter was used.    Breanna Sullivan is a 56 y.o. female who presents to the Emergency Department complaining of worsening right foot pain that began about three months ago. She reports an associated knot to the medial plantar aspect. She has not taken anything for pain. Walking on the foot and touching the area increases the pain. She denies alleviating factors. She denies fever, chills, nausea, vomiting, numbness, tingling or weakness of the right foot. She denies any trauma, injury or fall.   Past Medical History:  Diagnosis Date  . Asthma with bronchitis   . Cellulitis of right lower leg    "I&D in ER on 11/12/2014; admitted 11/15/2014"  . Cervical cancer ([place])    "they froze it"  . COPD (chronic obstructive pulmonary disease) ([place])   . History of hiatal hernia   . Melanoma of back ([place])   . TIA (transient ischemic attack) ~ 2011   "mild", denies residual on 11/15/2014    Patient Active Problem List   Diagnosis Date Noted  . Cellulitis of right lower leg 11/15/2014  . Acute renal injury ([place]) 11/15/2014    Past Surgical History:  Procedure Laterality Date  . GYNECOLOGIC CRYOSURGERY     "for cervical cancer"  . MELANOMA EXCISION  2011   "back"  . TUBAL LIGATION  1990    OB History    No data available       Home Medications    Prior to Admission medications   Medication Sig Start Date End Date Taking? Authorizing Provider  albuterol (PROVENTIL HFA;VENTOLIN HFA) 108 (90 BASE) MCG/ACT inhaler Inhale 2 puffs into the lungs every 6 (six) hours  as needed for wheezing or shortness of breath.    Historical Provider, MD  Aspirin-Acetaminophen-Caffeine (GOODY HEADACHE PO) Take 1 packet by mouth daily as needed (pain).    Historical Provider, MD  cephALEXin (KEFLEX) 500 MG capsule Take 1 capsule (500 mg total) by mouth 2 (two) times daily. 06/17/15   Waynetta Pean, PA-C  HYDROcodone-acetaminophen (NORCO/VICODIN) 5-325 MG per tablet Take 2 tablets by mouth every 4 (four) hours as needed. 11/18/14   Florencia Reasons, MD  neomycin-bacitracin-polymyxin (NEOSPORIN) OINT Apply 1 application topically 3 (three) times daily as needed for wound care.    Historical Provider, MD  omeprazole (PRILOSEC) 20 MG capsule Take 1 capsule (20 mg total) by mouth daily. 06/17/15   Waynetta Pean, PA-C  ondansetron (ZOFRAN ODT) 4 MG disintegrating tablet Take 1 tablet (4 mg total) by mouth every 8 (eight) hours as needed for nausea or vomiting. 06/17/15   Waynetta Pean, PA-C  ondansetron (ZOFRAN) 4 MG tablet Take 1 tablet (4 mg total) by mouth every 6 (six) hours. Patient not taking: Reported on 11/15/2014 11/12/14   Gareth Morgan, MD  sulfamethoxazole-trimethoprim (BACTRIM DS,SEPTRA DS) 800-160 MG per tablet Take 1 tablet by mouth 2 (two) times daily. 11/18/14   Florencia Reasons, MD    Family History No family history on file.  Social History Social History  Substance Use Topics  . Smoking status: Current Every Day Smoker  Packs/day: 0.12    Years: 34.00    Types: Cigarettes  . Smokeless tobacco: Never Used  . Alcohol use No     Allergies   Asa buff (mag [buffered aspirin]; Chocolate; and Compazine   Review of Systems Review of Systems  Constitutional: Negative for chills and fever.  Gastrointestinal: Negative for nausea and vomiting.  Musculoskeletal: Positive for arthralgias.  Skin:       Nodule to plantar aspect of the right foot.  Neurological: Negative for weakness and numbness.     Physical Exam Updated Vital Signs BP (!) 144/73 (BP Location: Left Arm)    Pulse 78   Temp 97.8 F (36.6 C) (Oral)   Resp 18   Ht 5\' 2"  (1.575 m)   Wt 150 lb (68 kg)   SpO2 98%   BMI 27.44 kg/m   Physical Exam  Constitutional: She is oriented to person, place, and time. She appears well-developed and well-nourished.  HENT:  Head: Normocephalic and atraumatic.  Neck: Normal range of motion.  Cardiovascular: Normal rate.   Pulmonary/Chest: Effort normal.  Musculoskeletal: Normal range of motion.  Mid arch of right foot with firm nodule that is tender to palpation. No erythema or swelling.  Neurological: She is alert and oriented to person, place, and time.  Skin: Skin is warm and dry.  Psychiatric: She has a normal mood and affect. Her behavior is normal.  Nursing note and vitals reviewed.    ED Treatments / Results  DIAGNOSTIC STUDIES: Oxygen Saturation is 98% on RA, normal by my interpretation.   COORDINATION OF CARE: 3:58 PM- Will have Dr. Alvino Chapel examine patient. Will refer to podiatry. Pt verbalizes understanding and agrees to plan.  3:44 PM- Pt looked up in the [place] and was not found to have any controlled substances filled.   Medications - No data to display  Labs (all labs ordered are listed, but only abnormal results are displayed) Labs Reviewed - No data to display  EKG  EKG Interpretation None       Radiology No results found.  Procedures Procedures (including critical care time)  Medications Ordered in ED Medications - No data to display   Initial Impression / Assessment and Plan / ED Course  I have reviewed the triage vital signs and the nursing notes.  Pertinent labs & imaging results that were available during my care of the patient were reviewed by me and considered in my medical decision making (see chart for details).    Patient with foot pain. There are some degenerative changes on her x-ray. However, no significant abnormalities. I suspect the patient has a plantar wart on her foot causing her pain.  Advised patient to use over-the-counter salicylic patches and follow-up with the podiatrist. She is given crutches to help her ambulate as she has significant pain with ambulation. No signs of infection. Patient appears safe for discharge. As time  Final Clinical Impressions(s) / ED Diagnoses   Final diagnoses:  Foot pain, right  Plantar wart of right foot    New Prescriptions New Prescriptions   No medications on file    I personally performed the services described in this documentation, which was scribed in my presence. The recorded information has been reviewed and is accurate.       Margarita Mail, PA-C 08/29/16 [place], MD 08/31/16 [phone]

## 2016-08-27 NOTE — Discharge Instructions (Signed)
Your xray was negative. Buy over the counter medicated pads for plantar warts at the drug store and use them as directed. Follow up with Dr. Amalia Hailey.   Get help right away if: Your foot is numb or tingling. Your foot or toes are swollen. Your foot or toes turn white or blue. You have warmth and redness along your foot.

## 2017-01-13 ENCOUNTER — Emergency Department (HOSPITAL_BASED_OUTPATIENT_CLINIC_OR_DEPARTMENT_OTHER)
Admission: EM | Admit: 2017-01-13 | Discharge: 2017-01-13 | Disposition: A | Discharge status: Home / Self Care | Payer: Self-pay | Attending: Emergency Medicine | Admitting: Emergency Medicine

## 2017-01-13 ENCOUNTER — Encounter (HOSPITAL_BASED_OUTPATIENT_CLINIC_OR_DEPARTMENT_OTHER): Payer: Self-pay | Admitting: Emergency Medicine

## 2017-01-13 DIAGNOSIS — Z85820 Personal history of malignant melanoma of skin: Secondary | ICD-10-CM | POA: Insufficient documentation

## 2017-01-13 DIAGNOSIS — J449 Chronic obstructive pulmonary disease, unspecified: Secondary | ICD-10-CM | POA: Insufficient documentation

## 2017-01-13 DIAGNOSIS — L0291 Cutaneous abscess, unspecified: Secondary | ICD-10-CM | POA: Insufficient documentation

## 2017-01-13 DIAGNOSIS — J45909 Unspecified asthma, uncomplicated: Secondary | ICD-10-CM | POA: Insufficient documentation

## 2017-01-13 DIAGNOSIS — F17210 Nicotine dependence, cigarettes, uncomplicated: Secondary | ICD-10-CM | POA: Insufficient documentation

## 2017-01-13 DIAGNOSIS — Z8541 Personal history of malignant neoplasm of cervix uteri: Secondary | ICD-10-CM | POA: Insufficient documentation

## 2017-01-13 DIAGNOSIS — Z79899 Other long term (current) drug therapy: Secondary | ICD-10-CM | POA: Insufficient documentation

## 2017-01-13 MED ORDER — TRAMADOL HCL 50 MG PO TABS: 50.0000 mg | ORAL_TABLET | Freq: Four times a day (QID) | ORAL | 0 refills | Status: DC | PRN

## 2017-01-13 MED ORDER — LIDOCAINE HCL 2 % IJ SOLN
20.0000 mL | Freq: Once | INTRAMUSCULAR | Status: AC
  Administered 2017-01-13: 400 mg
  Filled 2017-01-13: qty 20

## 2017-01-13 MED ORDER — SULFAMETHOXAZOLE-TRIMETHOPRIM 800-160 MG PO TABS
1.0000 | ORAL_TABLET | Freq: Once | ORAL | Status: AC
Start: 2017-01-13 — End: 2017-01-13
  Administered 2017-01-13: 1 via ORAL
  Filled 2017-01-13: qty 1

## 2017-01-13 MED ORDER — SULFAMETHOXAZOLE-TRIMETHOPRIM 800-160 MG PO TABS: 1.0000 | ORAL_TABLET | Freq: Two times a day (BID) | ORAL | 0 refills | Status: AC

## 2017-01-13 NOTE — ED Triage Notes (Signed)
Abscess to R axilla x 3 days.

## 2017-01-13 NOTE — ED Provider Notes (Signed)
[facility] DEPT [facility] Provider Note   CSN: [id number] Arrival date & time: 01/13/17  1558    History   Chief Complaint Chief Complaint  Patient presents with  . Abscess    HPI Breanna Sullivan is a 56 y.o. female.  HPI   56 year old female presents today with abscess to right axilla. She has a history of the same that was I indeed in place on antibiotics. Patient notes symptoms started approximately 3 days ago with redness, tenderness and firm area under the right axilla. She denies any fever chills nausea or vomiting. She denies any history of skin infections or MRSA. On occasions prior to arrival. Patient is on blood thinners.    Past Medical History:  Diagnosis Date  . Asthma with bronchitis   . Cellulitis of right lower leg    "I&D in ER on 11/12/2014; admitted 11/15/2014"  . Cervical cancer ([place])    "they froze it"  . COPD (chronic obstructive pulmonary disease) ([place])   . History of hiatal hernia   . Melanoma of back ([place])   . TIA (transient ischemic attack) ~ 2011   "mild", denies residual on 11/15/2014    Patient Active Problem List   Diagnosis Date Noted  . Cellulitis of right lower leg 11/15/2014  . Acute renal injury ([place]) 11/15/2014    Past Surgical History:  Procedure Laterality Date  . GYNECOLOGIC CRYOSURGERY     "for cervical cancer"  . MELANOMA EXCISION  2011   "back"  . TUBAL LIGATION  1990    OB History    No data available       Home Medications    Prior to Admission medications   Medication Sig Start Date End Date Taking? Authorizing Provider  albuterol (PROVENTIL HFA;VENTOLIN HFA) 108 (90 BASE) MCG/ACT inhaler Inhale 2 puffs into the lungs every 6 (six) hours as needed for wheezing or shortness of breath.    [provider]  Aspirin-Acetaminophen-Caffeine (GOODY HEADACHE PO) Take 1 packet by mouth daily as needed (pain).    [provider]  cephALEXin (KEFLEX) 500 MG capsule Take 1 capsule (500 mg total) by mouth 2 (two)  times daily. 06/17/15   Waynetta Pean, PA-C  HYDROcodone-acetaminophen (NORCO/VICODIN) 5-325 MG per tablet Take 2 tablets by mouth every 4 (four) hours as needed. 11/18/14   Florencia Reasons, MD  meloxicam (MOBIC) 15 MG tablet Take 1 tablet (15 mg total) by mouth daily. 08/27/16   Harris, Vernie Shanks, PA-C  neomycin-bacitracin-polymyxin (NEOSPORIN) OINT Apply 1 application topically 3 (three) times daily as needed for wound care.    [provider]  omeprazole (PRILOSEC) 20 MG capsule Take 1 capsule (20 mg total) by mouth daily. 06/17/15   Waynetta Pean, PA-C  ondansetron (ZOFRAN ODT) 4 MG disintegrating tablet Take 1 tablet (4 mg total) by mouth every 8 (eight) hours as needed for nausea or vomiting. 06/17/15   Waynetta Pean, PA-C  ondansetron (ZOFRAN) 4 MG tablet Take 1 tablet (4 mg total) by mouth every 6 (six) hours. Patient not taking: Reported on 11/15/2014 11/12/14   Gareth Morgan, MD  sulfamethoxazole-trimethoprim (BACTRIM DS,SEPTRA DS) 800-160 MG tablet Take 1 tablet by mouth 2 (two) times daily. 01/13/17 01/20/17  Essynce Munsch, Dellis Filbert, PA-C  traMADol (ULTRAM) 50 MG tablet Take 1 tablet (50 mg total) by mouth every 6 (six) hours as needed. 08/27/16   Margarita Mail, PA-C    Family History No family history on file.  Social History Social History  Substance Use Topics  . Smoking status:  Current Every Day Smoker    Packs/day: 0.12    Years: 34.00    Types: Cigarettes  . Smokeless tobacco: Never Used  . Alcohol use No     Allergies   Asa buff (mag [buffered aspirin]; Chocolate; and Compazine   Review of Systems Review of Systems  All other systems reviewed and are negative.    Physical Exam Updated Vital Signs BP (!) 155/90 (BP Location: Left Arm)   Pulse 95   Temp 98.2 F (36.8 C) (Oral)   Resp 20   Ht 5\' 2"  (1.575 m)   Wt 73.5 kg (162 lb)   SpO2 96%   BMI 29.63 kg/m   Physical Exam  Constitutional: She is oriented to person, place, and time. She appears  well-developed and well-nourished.  HENT:  Head: Normocephalic and atraumatic.  Eyes: Pupils are equal, round, and reactive to light. Conjunctivae are normal. Right eye exhibits no discharge. Left eye exhibits no discharge. No scleral icterus.  Neck: Normal range of motion. No JVD present. No tracheal deviation present.  Pulmonary/Chest: Effort normal. No stridor.  Neurological: She is alert and oriented to person, place, and time. Coordination normal.  Skin:  2 cm area of induration with central fluctuance to the right axilla, minor overlying redness  Psychiatric: She has a normal mood and affect. Her behavior is normal. Judgment and thought content normal.  Nursing note and vitals reviewed.    ED Treatments / Results  Labs (all labs ordered are listed, but only abnormal results are displayed) Labs Reviewed - No data to display  EKG  EKG Interpretation None       Radiology No results found.  Procedures .Marland KitchenIncision and Drainage Date/Time: 01/13/2017 5:40 PM Performed by: Penni Bombard, Hymie Gorr Authorized by: Penni Bombard, Mily Malecki   Consent:    Consent obtained:  Verbal   Consent given by:  Patient   Risks discussed:  Bleeding, damage to other organs, incomplete drainage, infection and pain   Alternatives discussed:  No treatment, delayed treatment, alternative treatment and observation Location:    Type:  Abscess   Size:  2 cm    Location: axilla. Pre-procedure details:    Skin preparation:  Betadine Anesthesia (see MAR for exact dosages):    Anesthesia method:  Local infiltration   Local anesthetic:  Lidocaine 2% w/o epi Procedure type:    Complexity:  Simple Procedure details:    Needle aspiration: no     Incision types:  Single straight   Incision depth:  Dermal   Scalpel blade:  11   Wound management:  Probed and deloculated   Drainage:  Bloody   Drainage amount:  Scant   Wound treatment:  Wound left open   Packing materials:  1/4 in gauze Post-procedure details:     Patient tolerance of procedure:  Tolerated well, no immediate complications   (including critical care time)  Medications Ordered in ED Medications  sulfamethoxazole-trimethoprim (BACTRIM DS,SEPTRA DS) 800-160 MG per tablet 1 tablet (not administered)  lidocaine (XYLOCAINE) 2 % (with pres) injection 400 mg (400 mg Infiltration Given by Other 01/13/17 1705)     Initial Impression / Assessment and Plan / ED Course  I have reviewed the triage vital signs and the nursing notes.  Pertinent labs & imaging results that were available during my care of the patient were reviewed by me and considered in my medical decision making (see chart for details).     Final Clinical Impressions(s) / ED Diagnoses   Final diagnoses:  Abscess  This 56 year old female with abscess here. Patient had 90 that was successful with no complications. Patient will be given a dose of Anaprox here discharged home on oral antibiotics, wound care instructions given, her stroke given. Verbalized understanding and agreement to today's plan.  New Prescriptions New Prescriptions   SULFAMETHOXAZOLE-TRIMETHOPRIM (BACTRIM DS,SEPTRA DS) 800-160 MG TABLET    Take 1 tablet by mouth 2 (two) times daily.     Okey Regal, PA-C 01/13/17 1742    Gareth Morgan, MD 01/16/17 [phone]

## 2017-01-13 NOTE — Discharge Instructions (Signed)
Please read attached information. If you experience any new or worsening signs or symptoms please return to the emergency room for evaluation. Please follow-up with your primary care provider or specialist as discussed. Please use medication prescribed only as directed and discontinue taking if you have any concerning signs or symptoms.   °

## 2018-01-08 ENCOUNTER — Encounter: Payer: Self-pay | Admitting: Family Medicine

## 2018-01-08 ENCOUNTER — Ambulatory Visit: Payer: Self-pay | Attending: Family Medicine | Admitting: Family Medicine

## 2018-01-08 VITALS — BP 192/104 | HR 89 | Temp 98.5°F | Resp 20 | Ht 63.0 in | Wt 160.8 lb

## 2018-01-08 DIAGNOSIS — J449 Chronic obstructive pulmonary disease, unspecified: Secondary | ICD-10-CM | POA: Insufficient documentation

## 2018-01-08 DIAGNOSIS — M545 Low back pain: Secondary | ICD-10-CM

## 2018-01-08 DIAGNOSIS — M1612 Unilateral primary osteoarthritis, left hip: Secondary | ICD-10-CM

## 2018-01-08 DIAGNOSIS — Z8541 Personal history of malignant neoplasm of cervix uteri: Secondary | ICD-10-CM | POA: Insufficient documentation

## 2018-01-08 DIAGNOSIS — Z85820 Personal history of malignant melanoma of skin: Secondary | ICD-10-CM

## 2018-01-08 DIAGNOSIS — M47816 Spondylosis without myelopathy or radiculopathy, lumbar region: Secondary | ICD-10-CM | POA: Insufficient documentation

## 2018-01-08 DIAGNOSIS — Z23 Encounter for immunization: Secondary | ICD-10-CM

## 2018-01-08 DIAGNOSIS — G8929 Other chronic pain: Secondary | ICD-10-CM

## 2018-01-08 DIAGNOSIS — M160 Bilateral primary osteoarthritis of hip: Secondary | ICD-10-CM | POA: Insufficient documentation

## 2018-01-08 DIAGNOSIS — Z79899 Other long term (current) drug therapy: Secondary | ICD-10-CM | POA: Insufficient documentation

## 2018-01-08 DIAGNOSIS — Z791 Long term (current) use of non-steroidal anti-inflammatories (NSAID): Secondary | ICD-10-CM | POA: Insufficient documentation

## 2018-01-08 DIAGNOSIS — Z9889 Other specified postprocedural states: Secondary | ICD-10-CM | POA: Insufficient documentation

## 2018-01-08 DIAGNOSIS — Z7982 Long term (current) use of aspirin: Secondary | ICD-10-CM | POA: Insufficient documentation

## 2018-01-08 DIAGNOSIS — R739 Hyperglycemia, unspecified: Secondary | ICD-10-CM

## 2018-01-08 DIAGNOSIS — L989 Disorder of the skin and subcutaneous tissue, unspecified: Secondary | ICD-10-CM | POA: Insufficient documentation

## 2018-01-08 DIAGNOSIS — K219 Gastro-esophageal reflux disease without esophagitis: Secondary | ICD-10-CM

## 2018-01-08 DIAGNOSIS — M25552 Pain in left hip: Secondary | ICD-10-CM

## 2018-01-08 DIAGNOSIS — F17210 Nicotine dependence, cigarettes, uncomplicated: Secondary | ICD-10-CM | POA: Insufficient documentation

## 2018-01-08 DIAGNOSIS — Z8673 Personal history of transient ischemic attack (TIA), and cerebral infarction without residual deficits: Secondary | ICD-10-CM | POA: Insufficient documentation

## 2018-01-08 DIAGNOSIS — Z79891 Long term (current) use of opiate analgesic: Secondary | ICD-10-CM | POA: Insufficient documentation

## 2018-01-08 MED ORDER — TRAMADOL HCL 50 MG PO TABS: 50.0000 mg | ORAL_TABLET | Freq: Two times a day (BID) | ORAL | 2 refills | Status: AC

## 2018-01-08 MED ORDER — RANITIDINE HCL 150 MG PO TABS: 150.0000 mg | ORAL_TABLET | Freq: Two times a day (BID) | ORAL | 6 refills | Status: DC

## 2018-01-08 MED ORDER — MELOXICAM 7.5 MG PO TABS: 7.5000 mg | ORAL_TABLET | Freq: Every day | ORAL | 3 refills | Status: DC

## 2018-01-08 NOTE — Progress Notes (Signed)
Flu shot: yes Pain left hip to leg: shooting pain  Patient stated she is not currently taking any medications  [facility]:  [address]

## 2018-01-08 NOTE — Progress Notes (Signed)
Subjective:  Patient ID: Breanna Sullivan, female    DOB: 1960-07-12  Age: 57 y.o. MRN: [id number]  CC: Hip Pain   HPI Breanna Sullivan presents for establishment of care and patient with complaint of left hip and leg pain with radiation. (On review of chart, patient was seen in the emergency department on 11/18/2017 secondary to complaint of left hip pain.  Per x-ray report, patient with moderate degenerative joint disease of left hip and mild degenerative joint disease right hip as well as lumbar spondylosis.)  Patient reports difficulty walking secondary to her left hip pain.  Patient also with complaint of some chronic low back pain that does not radiate.  Patient states that she is out of pain medication given at the emergency department visit in [place].  Patient is taking over-the-counter medication but states that this really does not minimize the pain.  Patient has chronic pain that worsens with getting up from a seated position, walking and patient cannot sleep on her left side secondary to increased pain.  Patient states that pain at rest is a 5-6 and pain at other times it is about a 12 on a 0-to-10 scale.      Patient reports that she has had a melanoma removed from her right shoulder in the past and patient states that she now has a skin lesion on the left.  Patient states that the area becomes dry and scabbed over and she is recently picked up the area and remove the scab so that there is no true skin lesion at today's visit the patient is concerned regarding a recurrence of melanoma.  Patient reports some reflux/belching which she believes is related to her use of over-the-counter pain medications.  Patient denies any actual abdominal pain.  Patient has had no blood in the stool and no dark stools.  Past Medical History:  Diagnosis Date  . Asthma with bronchitis   . Cellulitis of right lower leg    "I&D in ER on 11/12/2014; admitted 11/15/2014"  . Cervical cancer ([place])    "they froze it"    . COPD (chronic obstructive pulmonary disease) ([place])   . History of hiatal hernia   . Melanoma of back ([place])   . TIA (transient ischemic attack) ~ 2011   "mild", denies residual on 11/15/2014    Past Surgical History:  Procedure Laterality Date  . GYNECOLOGIC CRYOSURGERY     "for cervical cancer"  . MELANOMA EXCISION  2011   "back"  . TUBAL LIGATION  1990    History reviewed. No pertinent family history.  Social History   Tobacco Use  . Smoking status: Current Every Day Smoker    Packs/day: 0.50    Years: 34.00    Pack years: 17.00    Types: Cigarettes  . Smokeless tobacco: Never Used  Substance Use Topics  . Alcohol use: No    ROS Review of Systems  Constitutional: Positive for fatigue. Negative for chills and fever.  HENT: Negative for sore throat and trouble swallowing.   Respiratory: Negative for cough.   Cardiovascular: Negative for chest pain, palpitations and leg swelling.  Gastrointestinal: Negative for abdominal pain and nausea.  Genitourinary: Negative for dysuria and frequency.  Musculoskeletal: Positive for arthralgias, back pain and gait problem.  Neurological: Negative for dizziness and headaches.    Objective:  Physical exam Today's Vitals: BP (!) 192/104   Pulse 89   Temp 98.5 F (36.9 C) (Oral)   Resp 20   Ht 5\' 3"  (  1.6 m)   Wt 160 lb 12.8 oz (72.9 kg)   SpO2 90%   BMI 28.48 kg/m  Vital signs and nurse's notes reviewed General-well-nourished, well-developed female with acute distress while sitting a chair but patient expresses discomfort with attempt to stand with stance that limits weightbearing on the left lower extremity Neck-supple, no lymphadenopathy, no thyromegaly Lungs-clear to auscultation bilaterally. Cardiovascular-regular rate and rhythm Abdomen-soft, nontender.  Back-no CVA tenderness, patient does have lumbosacral discomfort and bilateral SI joint discomfort to palpation. Musculoskeletal-patient with tenderness over the  greater trochanter of left hip and voluntary guarding with attempts at internal and external left hip rotation Extremities- no edema Skin- patient with a circular, flesh-colored, rough textured less than dime sized area on the left upper outer thigh  Physical Exam  Assessment & Plan:  1. Left hip pain Patient with complaint of worsening of chronic left hip pain for which she has been seen back in July at the emergency department.  Patient will be referred to orthopedics for evaluation.  Patient was provided with prescription for Mobic for daily use for less severe pain.  Patient should take after meal.  Patient also given prescription for tramadol to take as needed for more severe pain. - Ambulatory referral to Orthopedic Surgery - traMADol (ULTRAM) 50 MG tablet; Take 1 tablet (50 mg total) by mouth 2 (two) times daily.  Dispense: 60 tablet; Refill: 2 - meloxicam (MOBIC) 7.5 MG tablet; Take 1 tablet (7.5 mg total) by mouth daily. For arthritis pain.  Take after eating  Dispense: 30 tablet; Refill: 3  2. Primary osteoarthritis of left hip Patient has had x-ray of the left hip done during her recent emergency department visit and patient with moderate degenerative joint disease of the left hip and mild right hip degenerative joint disease.  Patient is being referred to orthopedics for further evaluation and treatment - Ambulatory referral to Orthopedic Surgery - traMADol (ULTRAM) 50 MG tablet; Take 1 tablet (50 mg total) by mouth 2 (two) times daily.  Dispense: 60 tablet; Refill: 2 - meloxicam (MOBIC) 7.5 MG tablet; Take 1 tablet (7.5 mg total) by mouth daily. For arthritis pain.  Take after eating  Dispense: 30 tablet; Refill: 3  3. Gastroesophageal reflux disease, esophagitis presence not specified Patient with complaint of some increase in acid reflux symptoms and prescription provided for Zantac 150 mg twice daily and reduce stomach acid/over stomach protection due to use of nonsteroidal  anti-inflammatories and other medications for pain - ranitidine (ZANTAC) 150 MG tablet; Take 1 tablet (150 mg total) by mouth 2 (two) times daily.  Dispense: 60 tablet; Refill: 6  4. Elevated blood sugar level Review of chart, patient has had slightly elevated blood sugar in the past.  Patient will have hemoglobin A1c done at today's visit to see if she may be prediabetic or diabetic - HgB A1c  5. History of melanoma Patient reports history of removal of melanoma from the right shoulder and patient now with complaint of a dry skin lesion on the right thigh.  Patient will be referred to dermatology for further evaluation and treatment - Ambulatory referral to Dermatology  6. Chronic bilateral low back pain without sciatica Patient with complaint of chronic low back pain.  Patient's x-ray of her hip done earlier this year also showed some evidence of lumbar spondylosis.  Prescription provided for Mobic to take for less severe pain and prescription for tramadol for more severe pain.  Once patient is feeling better, she may benefit from  physical therapy/increasing her level of activity  7. Need for immunization against influenza Patient was offered and agreed to receive influenza immunization at today's visit.  Patient also received educational handout.    Outpatient Encounter Medications as of 01/08/2018  Medication Sig  . albuterol (PROVENTIL HFA;VENTOLIN HFA) 108 (90 BASE) MCG/ACT inhaler Inhale 2 puffs into the lungs every 6 (six) hours as needed for wheezing or shortness of breath.  . Aspirin-Acetaminophen-Caffeine (GOODY HEADACHE PO) Take 1 packet by mouth daily as needed (pain).  . cephALEXin (KEFLEX) 500 MG capsule Take 1 capsule (500 mg total) by mouth 2 (two) times daily. (Patient not taking: Reported on 01/08/2018)  . HYDROcodone-acetaminophen (NORCO/VICODIN) 5-325 MG per tablet Take 2 tablets by mouth every 4 (four) hours as needed. (Patient not taking: Reported on 01/08/2018)  .  meloxicam (MOBIC) 7.5 MG tablet Take 1 tablet (7.5 mg total) by mouth daily. For arthritis pain.  Take after eating  . neomycin-bacitracin-polymyxin (NEOSPORIN) OINT Apply 1 application topically 3 (three) times daily as needed for wound care.  [name] omeprazole (PRILOSEC) 20 MG capsule Take 1 capsule (20 mg total) by mouth daily. (Patient not taking: Reported on 01/08/2018)  . ondansetron (ZOFRAN ODT) 4 MG disintegrating tablet Take 1 tablet (4 mg total) by mouth every 8 (eight) hours as needed for nausea or vomiting. (Patient not taking: Reported on 01/08/2018)  . ondansetron (ZOFRAN) 4 MG tablet Take 1 tablet (4 mg total) by mouth every 6 (six) hours. (Patient not taking: Reported on 11/15/2014)  . ranitidine (ZANTAC) 150 MG tablet Take 1 tablet (150 mg total) by mouth 2 (two) times daily.  . traMADol (ULTRAM) 50 MG tablet Take 1 tablet (50 mg total) by mouth 2 (two) times daily.  . [DISCONTINUED] meloxicam (MOBIC) 15 MG tablet Take 1 tablet (15 mg total) by mouth daily. (Patient not taking: Reported on 01/08/2018)  . [DISCONTINUED] traMADol (ULTRAM) 50 MG tablet Take 1 tablet (50 mg total) by mouth every 6 (six) hours as needed. (Patient not taking: Reported on 01/08/2018)   No facility-administered encounter medications on file as of 01/08/2018.    An After Visit Summary was printed and given to the patient.  Follow-up: Return in about 4 weeks (around 02/05/2018).    Antony Blackbird MD

## 2018-02-07 ENCOUNTER — Ambulatory Visit: Payer: Self-pay | Admitting: Family Medicine

## 2018-05-25 ENCOUNTER — Other Ambulatory Visit: Payer: Self-pay

## 2018-05-25 ENCOUNTER — Emergency Department (HOSPITAL_BASED_OUTPATIENT_CLINIC_OR_DEPARTMENT_OTHER)
Admission: EM | Admit: 2018-05-25 | Discharge: 2018-05-26 | Disposition: A | Discharge status: Home / Self Care | Payer: Self-pay | Attending: Emergency Medicine | Admitting: Emergency Medicine

## 2018-05-25 ENCOUNTER — Emergency Department (HOSPITAL_BASED_OUTPATIENT_CLINIC_OR_DEPARTMENT_OTHER): Payer: Self-pay

## 2018-05-25 ENCOUNTER — Encounter (HOSPITAL_BASED_OUTPATIENT_CLINIC_OR_DEPARTMENT_OTHER): Payer: Self-pay | Admitting: Emergency Medicine

## 2018-05-25 DIAGNOSIS — M1612 Unilateral primary osteoarthritis, left hip: Secondary | ICD-10-CM | POA: Insufficient documentation

## 2018-05-25 DIAGNOSIS — J449 Chronic obstructive pulmonary disease, unspecified: Secondary | ICD-10-CM | POA: Insufficient documentation

## 2018-05-25 DIAGNOSIS — F17210 Nicotine dependence, cigarettes, uncomplicated: Secondary | ICD-10-CM | POA: Insufficient documentation

## 2018-05-25 DIAGNOSIS — M25552 Pain in left hip: Secondary | ICD-10-CM | POA: Insufficient documentation

## 2018-05-25 DIAGNOSIS — G8929 Other chronic pain: Secondary | ICD-10-CM | POA: Insufficient documentation

## 2018-05-25 MED ORDER — HYDROCODONE-ACETAMINOPHEN 5-325 MG PO TABS
1.0000 | ORAL_TABLET | Freq: Once | ORAL | Status: AC
  Administered 2018-05-25: 1 via ORAL
  Filled 2018-05-25: qty 1

## 2018-05-25 MED ORDER — MELOXICAM 15 MG PO TABS: 15.0000 mg | ORAL_TABLET | Freq: Every day | ORAL | 0 refills | Status: DC

## 2018-05-25 MED ORDER — HYDROCODONE-ACETAMINOPHEN 5-325 MG PO TABS: 1.0000 | ORAL_TABLET | Freq: Four times a day (QID) | ORAL | 0 refills | Status: DC | PRN

## 2018-05-25 NOTE — ED Triage Notes (Signed)
Patient states that she has had pain to her left leg for the last 6 months. Went to the hospital in the past for it and told that she was having problems with her hip but she reports " its my leg" - patient is hyperventilating in triage " because it hurts so bad"

## 2018-05-25 NOTE — ED Notes (Signed)
ED Provider at bedside. 

## 2018-05-25 NOTE — ED Provider Notes (Signed)
[facility] Provider Note: Breanna Spurling, MD, FACEP  CSN: [id number] MRN: [id number] ARRIVAL: 05/25/18 at 2144 ROOM: [place]  Leg Pain   HISTORY OF PRESENT ILLNESS  05/25/18 11:05 PM Breanna Sullivan is a 58 y.o. female with a history of chronic pain in her left hip and thigh for "3 to 4 months".  The pain has acutely worsened recently.  She rates it is severe.  It is worse with movement of the left hip or the left knee.  It radiates to the lower leg.  Her left hip is tender to palpation but not her left lower leg.  She denies recent injury.  She has been told in the past that the pain is due to osteoarthritis of the left hip.  He has been taking over-the-counter medication without relief.   Past Medical History:  Diagnosis Date  . Asthma with bronchitis   . Cellulitis of right lower leg    "I&D in ER on 11/12/2014; admitted 11/15/2014"  . Cervical cancer ([place])    "they froze it"  . COPD (chronic obstructive pulmonary disease) ([place])   . History of hiatal hernia   . Melanoma of back ([place])   . TIA (transient ischemic attack) ~ 2011   "mild", denies residual on 11/15/2014    Past Surgical History:  Procedure Laterality Date  . GYNECOLOGIC CRYOSURGERY     "for cervical cancer"  . MELANOMA EXCISION  2011   "back"  . TUBAL LIGATION  1990    History reviewed. No pertinent family history.  Social History   Tobacco Use  . Smoking status: Current Every Day Smoker    Packs/day: 0.50    Years: 34.00    Pack years: 17.00    Types: Cigarettes  . Smokeless tobacco: Never Used  Substance Use Topics  . Alcohol use: No  . Drug use: No    Prior to Admission medications   Medication Sig Start Date End Date Taking? Authorizing Provider  albuterol (PROVENTIL HFA;VENTOLIN HFA) 108 (90 BASE) MCG/ACT inhaler Inhale 2 puffs into the lungs every 6 (six) hours as needed for wheezing or shortness of breath.    [provider]    Aspirin-Acetaminophen-Caffeine (GOODY HEADACHE PO) Take 1 packet by mouth daily as needed (pain).    [provider]  cephALEXin (KEFLEX) 500 MG capsule Take 1 capsule (500 mg total) by mouth 2 (two) times daily. Patient not taking: Reported on 01/08/2018 06/17/15   Waynetta Pean, PA-C  HYDROcodone-acetaminophen (NORCO/VICODIN) 5-325 MG per tablet Take 2 tablets by mouth every 4 (four) hours as needed. Patient not taking: Reported on 01/08/2018 11/18/14   Florencia Reasons, MD  meloxicam (MOBIC) 7.5 MG tablet Take 1 tablet (7.5 mg total) by mouth daily. For arthritis pain.  Take after eating 01/08/18   Fulp, Cammie, MD  neomycin-bacitracin-polymyxin (NEOSPORIN) OINT Apply 1 application topically 3 (three) times daily as needed for wound care.    [provider]  omeprazole (PRILOSEC) 20 MG capsule Take 1 capsule (20 mg total) by mouth daily. Patient not taking: Reported on 01/08/2018 06/17/15   Waynetta Pean, PA-C  ondansetron (ZOFRAN ODT) 4 MG disintegrating tablet Take 1 tablet (4 mg total) by mouth every 8 (eight) hours as needed for nausea or vomiting. Patient not taking: Reported on 01/08/2018 06/17/15   Waynetta Pean, PA-C  ondansetron (ZOFRAN) 4 MG tablet Take 1 tablet (4 mg total) by mouth every 6 (six) hours. Patient not taking: Reported on 11/15/2014 11/12/14  Gareth Morgan, MD  ranitidine (ZANTAC) 150 MG tablet Take 1 tablet (150 mg total) by mouth 2 (two) times daily. 01/08/18   Fulp, Ander Gaster, MD    Allergies Asa buff (mag [buffered aspirin]; Chocolate; and Compazine   REVIEW OF SYSTEMS  Negative except as noted here or in the History of Present Illness.   PHYSICAL EXAMINATION  Initial Vital Signs Blood pressure (!) 171/85, pulse (!) 108, temperature 98.2 F (36.8 C), temperature source Oral, resp. rate 20, height 5\' 2"  (1.575 m), weight 68 kg, SpO2 100 %.  Examination General: Well-developed, well-nourished female in no acute distress; appearance consistent with age  of record HENT: normocephalic; atraumatic Eyes: Normal appearance Neck: supple Heart: regular rate and rhythm Lungs: clear to auscultation bilaterally Abdomen: soft; nondistended; nontender; bowel sounds present Extremities: No deformity; tenderness of left thigh without induration or warmth, pain on passive movement of left hip or left knee Neurologic: Awake, alert and oriented; motor function intact in all extremities and symmetric; no facial droop Skin: Warm and dry Psychiatric: Tearful; mildly agitated   RESULTS  Summary of this visit's results, reviewed by myself:   EKG Interpretation  Date/Time:    Ventricular Rate:    PR Interval:    QRS Duration:   QT Interval:    QTC Calculation:   R Axis:     Text Interpretation:        Laboratory Studies: No results found for this or any previous visit (from the past 24 hour(s)). Imaging Studies: Dg Femur Min 2 Views Left  Result Date: 05/25/2018 CLINICAL DATA:  Left leg pain EXAM: LEFT FEMUR 2 VIEWS COMPARISON:  None. FINDINGS: No fracture or malalignment. Moderate arthritis of the left hip with joint space narrowing and acetabular cyst. IMPRESSION: Moderate arthritis of the left hip.  No acute osseous abnormality. Electronically Signed   By: Donavan Foil M.D.   On: 05/25/2018 23:34    ED COURSE and MDM  Nursing notes and initial vitals signs, including pulse oximetry, reviewed.  Vitals:   05/25/18 2150 05/25/18 2151  BP:  (!) 171/85  Pulse:  (!) 108  Resp:  20  Temp:  98.2 F (36.8 C)  TempSrc:  Oral  SpO2:  100%  Weight: 68 kg   Height: 5\' 2"  (1.575 m)    We will treat patient's acute pain and refer to orthopedic surgery.  PROCEDURES    ED DIAGNOSES     ICD-10-CM   1. Chronic left hip pain M25.552    G89.29   2. Primary osteoarthritis of left hip M16.12        [name], Jenny Reichmann, MD 05/25/18 [phone]

## 2018-09-23 ENCOUNTER — Other Ambulatory Visit: Payer: Self-pay | Admitting: Family Medicine

## 2018-09-23 DIAGNOSIS — M25552 Pain in left hip: Secondary | ICD-10-CM

## 2018-09-23 DIAGNOSIS — M1612 Unilateral primary osteoarthritis, left hip: Secondary | ICD-10-CM

## 2019-02-05 ENCOUNTER — Other Ambulatory Visit: Payer: Self-pay

## 2019-02-05 ENCOUNTER — Emergency Department (HOSPITAL_BASED_OUTPATIENT_CLINIC_OR_DEPARTMENT_OTHER)
Admission: EM | Admit: 2019-02-05 | Discharge: 2019-02-05 | Disposition: A | Discharge status: Home / Self Care | Payer: Self-pay | Attending: Emergency Medicine | Admitting: Emergency Medicine

## 2019-02-05 ENCOUNTER — Encounter (HOSPITAL_BASED_OUTPATIENT_CLINIC_OR_DEPARTMENT_OTHER): Payer: Self-pay

## 2019-02-05 DIAGNOSIS — Z79899 Other long term (current) drug therapy: Secondary | ICD-10-CM | POA: Insufficient documentation

## 2019-02-05 DIAGNOSIS — Z8673 Personal history of transient ischemic attack (TIA), and cerebral infarction without residual deficits: Secondary | ICD-10-CM | POA: Insufficient documentation

## 2019-02-05 DIAGNOSIS — Z886 Allergy status to analgesic agent status: Secondary | ICD-10-CM | POA: Insufficient documentation

## 2019-02-05 DIAGNOSIS — L03115 Cellulitis of right lower limb: Secondary | ICD-10-CM | POA: Insufficient documentation

## 2019-02-05 DIAGNOSIS — Z91018 Allergy to other foods: Secondary | ICD-10-CM | POA: Insufficient documentation

## 2019-02-05 DIAGNOSIS — F17210 Nicotine dependence, cigarettes, uncomplicated: Secondary | ICD-10-CM | POA: Insufficient documentation

## 2019-02-05 DIAGNOSIS — Z888 Allergy status to other drugs, medicaments and biological substances status: Secondary | ICD-10-CM | POA: Insufficient documentation

## 2019-02-05 DIAGNOSIS — J449 Chronic obstructive pulmonary disease, unspecified: Secondary | ICD-10-CM | POA: Insufficient documentation

## 2019-02-05 MED ORDER — DOXYCYCLINE HYCLATE 100 MG PO CAPS: 100.0000 mg | ORAL_CAPSULE | Freq: Two times a day (BID) | ORAL | 0 refills | Status: AC

## 2019-02-05 MED ORDER — DOXYCYCLINE HYCLATE 100 MG PO TABS
100.0000 mg | ORAL_TABLET | Freq: Once | ORAL | Status: AC
  Administered 2019-02-05: 100 mg via ORAL
  Filled 2019-02-05: qty 1

## 2019-02-05 MED ORDER — MELOXICAM 7.5 MG PO TABS
15.0000 mg | ORAL_TABLET | Freq: Once | ORAL | Status: AC
  Administered 2019-02-05: 15 mg via ORAL
  Filled 2019-02-05: qty 2

## 2019-02-05 MED ORDER — MELOXICAM 15 MG PO TABS: 15.0000 mg | ORAL_TABLET | Freq: Every day | ORAL | 0 refills | Status: AC

## 2019-02-05 MED ORDER — ALBUTEROL SULFATE HFA 108 (90 BASE) MCG/ACT IN AERS
2.0000 | INHALATION_SPRAY | Freq: Once | RESPIRATORY_TRACT | Status: AC
  Administered 2019-02-05: 15:00:00 2 via RESPIRATORY_TRACT
  Filled 2019-02-05: qty 6.7

## 2019-02-05 NOTE — ED Triage Notes (Addendum)
Pt c/o ?abscess to right inner thigh x 3 days-pt also SOB x today-states she has COPD and is out of her inhaler-NAD-to triage in w/c

## 2019-02-05 NOTE — ED Provider Notes (Signed)
[facility] EMERGENCY DEPARTMENT Provider Note   CSN: [id number] Arrival date & time: 02/05/19  1448     History   Chief Complaint Chief Complaint  Patient presents with  . Abscess    HPI Breanna Sullivan is a 58 y.o. female.     Patient is a 58 year old female with past medical history of COPD presenting to the emergency department for right leg redness and pain.  Also presents the emergency department for medication refills.  Patient reports that yesterday she noticed a sore and red spot on her right inner thigh.  She reports that this morning she woke up and the spots seem to be bigger.  She denies any injury, trauma, wound.  Denies any fever, chills.  She also reports that she has a history of COPD and she has run out of her albuterol inhaler.  She tried calling her primary care doctor but they would not refill her medicine and would not see her because she owes money and she reports that she does not have insurance to see them.  She requests a refill on her albuterol inhaler as well as her meloxicam for her left chronic hip arthritis.  No new or worsening symptoms in the context of her COPD or hip arthritis.  No shortness of breath, chest pain, fever, COVID exposure.     Past Medical History:  Diagnosis Date  . Asthma with bronchitis   . Cellulitis of right lower leg    "I&D in ER on 11/12/2014; admitted 11/15/2014"  . Cervical cancer ([place])    "they froze it"  . COPD (chronic obstructive pulmonary disease) ([place])   . History of hiatal hernia   . Melanoma of back ([place])   . TIA (transient ischemic attack) ~ 2011   "mild", denies residual on 11/15/2014    Patient Active Problem List   Diagnosis Date Noted  . Cellulitis of right lower leg 11/15/2014  . Acute renal injury ([place]) 11/15/2014    Past Surgical History:  Procedure Laterality Date  . GYNECOLOGIC CRYOSURGERY     "for cervical cancer"  . MELANOMA EXCISION  2011   "back"  . TUBAL LIGATION  1990     OB  History   No obstetric history on file.      Home Medications    Prior to Admission medications   Medication Sig Start Date End Date Taking? Authorizing Provider  albuterol (PROVENTIL HFA;VENTOLIN HFA) 108 (90 BASE) MCG/ACT inhaler Inhale 2 puffs into the lungs every 6 (six) hours as needed for wheezing or shortness of breath.    [provider]  Aspirin-Acetaminophen-Caffeine (GOODY HEADACHE PO) Take 1 packet by mouth daily as needed (pain).    [provider]  doxycycline (VIBRAMYCIN) 100 MG capsule Take 1 capsule (100 mg total) by mouth 2 (two) times daily for 10 days. 02/05/19 02/15/19  Alveria Apley, PA-C  HYDROcodone-acetaminophen (NORCO) 5-325 MG tablet Take 1 tablet by mouth every 6 (six) hours as needed for severe pain. 05/25/18   Molpus, John, MD  meloxicam (MOBIC) 15 MG tablet Take 1 tablet (15 mg total) by mouth daily. 02/05/19 03/07/19  Alveria Apley, PA-C  ranitidine (ZANTAC) 150 MG tablet Take 1 tablet (150 mg total) by mouth 2 (two) times daily. 01/08/18   Antony Blackbird, MD    Family History No family history on file.  Social History Social History   Tobacco Use  . Smoking status: Current Every Day Smoker    Packs/day: 0.50  Years: 34.00    Pack years: 17.00    Types: Cigarettes  . Smokeless tobacco: Never Used  Substance Use Topics  . Alcohol use: No  . Drug use: No     Allergies   Asa buff (mag [buffered aspirin], Chocolate, and Compazine   Review of Systems Review of Systems  Constitutional: Negative for activity change, appetite change, chills, fatigue and fever.  HENT: Negative for congestion, rhinorrhea, sneezing, sore throat and trouble swallowing.   Respiratory: Positive for wheezing. Negative for cough and shortness of breath.   Cardiovascular: Negative for chest pain.  Gastrointestinal: Negative for abdominal pain, nausea and vomiting.  Genitourinary: Negative for dysuria.  Musculoskeletal: Positive for arthralgias and  myalgias. Negative for back pain.  Skin: Positive for color change. Negative for pallor, rash and wound.  Allergic/Immunologic: Negative for immunocompromised state.  Neurological: Negative for dizziness and light-headedness.     Physical Exam Updated Vital Signs BP (!) 152/96 (BP Location: Right Arm)   Pulse 77   Temp 98.5 F (36.9 C) (Oral)   Resp 20   SpO2 97%   Physical Exam Vitals signs and nursing note reviewed.  Constitutional:      Appearance: Normal appearance.  HENT:     Head: Normocephalic and atraumatic.     Nose: Nose normal. No congestion or rhinorrhea.     Mouth/Throat:     Mouth: Mucous membranes are moist.  Eyes:     Conjunctiva/sclera: Conjunctivae normal.  Cardiovascular:     Rate and Rhythm: Normal rate and regular rhythm.  Pulmonary:     Effort: Pulmonary effort is normal. No respiratory distress.     Breath sounds: No stridor. Wheezing (Bilateral decreased breath sounds with mild wheezing) present. No rhonchi or rales.  Chest:     Chest wall: No tenderness.  Skin:    General: Skin is warm and dry.     Comments: The left medial thigh is erythematous in an irregular shape about 5 cm x 3 cm.  No palpable abscess, wound or drainage.  The skin is warmer to the touch than compared to the other side.  Neurological:     Mental Status: She is alert.  Psychiatric:        Mood and Affect: Mood normal.      ED Treatments / Results  Labs (all labs ordered are listed, but only abnormal results are displayed) Labs Reviewed - No data to display  EKG None  Radiology No results found.  Procedures Procedures (including critical care time)  Medications Ordered in ED Medications  albuterol (VENTOLIN HFA) 108 (90 Base) MCG/ACT inhaler 2 puff (2 puffs Inhalation Given 02/05/19 1523)  doxycycline (VIBRA-TABS) tablet 100 mg (100 mg Oral Given 02/05/19 1517)  meloxicam (MOBIC) tablet 15 mg (15 mg Oral Given 02/05/19 1516)     Initial Impression / Assessment  and Plan / ED Course  I have reviewed the triage vital signs and the nursing notes.  Pertinent labs & imaging results that were available during my care of the patient were reviewed by me and considered in my medical decision making (see chart for details).  Clinical Course as of Feb 05 1604  Thu Feb 05, 2019  1537 58 y/o F with COPD here for R leg cellulitis x2 days. No previous treatment tried. Afebrile, not septic, no abscess. She also requests med refill because she cannot see her PMD due to insurance.  She was given albuterol, doxycycline and meloxicam here in ED   [KM]  Clinical Course User Index [KM] Alveria Apley, PA-C       Based on review of vitals, medical screening exam, lab work and/or imaging, there does not appear to be an acute, emergent etiology for the patient's symptoms. Counseled pt on good return precautions and encouraged both PCP and ED follow-up as needed.  Prior to discharge, I also discussed incidental imaging findings with patient in detail and advised appropriate, recommended follow-up in detail.  Clinical Impression: 1. Cellulitis of right lower extremity   2. COPD not affecting current episode of care [facility])     Disposition: Discharge  Prior to providing a prescription for a controlled substance, I independently reviewed the patient's recent prescription history on the [place]. The patient had no recent or regular prescriptions and was deemed appropriate for a brief, less than 3 day prescription of narcotic for acute analgesia.  This note was prepared with assistance of Systems analyst. Occasional wrong-word or sound-a-like substitutions may have occurred due to the inherent limitations of voice recognition software.   Final Clinical Impressions(s) / ED Diagnoses   Final diagnoses:  Cellulitis of right lower extremity  COPD not affecting current episode of care [facility])    ED Discharge  Orders         Ordered    doxycycline (VIBRAMYCIN) 100 MG capsule  2 times daily     02/05/19 1604    meloxicam (MOBIC) 15 MG tablet  Daily     02/05/19 1604           Kristine Royal 02/05/19 1605    Wyvonnia Dusky, MD 02/05/19 2012

## 2019-05-06 ENCOUNTER — Inpatient Hospital Stay: Payer: Self-pay | Admitting: Family Medicine

## 2019-05-15 ENCOUNTER — Ambulatory Visit: Payer: Self-pay | Admitting: Family Medicine

## 2019-05-21 ENCOUNTER — Ambulatory Visit: Payer: Self-pay | Attending: Family Medicine | Admitting: Family Medicine

## 2019-05-21 ENCOUNTER — Other Ambulatory Visit: Payer: Self-pay

## 2019-05-21 ENCOUNTER — Ambulatory Visit (HOSPITAL_BASED_OUTPATIENT_CLINIC_OR_DEPARTMENT_OTHER): Payer: Self-pay | Admitting: Licensed Clinical Social Worker

## 2019-05-21 ENCOUNTER — Encounter: Payer: Self-pay | Admitting: Family Medicine

## 2019-05-21 VITALS — BP 148/82 | HR 90 | Temp 99.2°F | Ht 63.0 in | Wt 200.0 lb

## 2019-05-21 DIAGNOSIS — G8929 Other chronic pain: Secondary | ICD-10-CM

## 2019-05-21 DIAGNOSIS — I10 Essential (primary) hypertension: Secondary | ICD-10-CM

## 2019-05-21 DIAGNOSIS — Z789 Other specified health status: Secondary | ICD-10-CM

## 2019-05-21 DIAGNOSIS — J449 Chronic obstructive pulmonary disease, unspecified: Secondary | ICD-10-CM

## 2019-05-21 DIAGNOSIS — M25552 Pain in left hip: Secondary | ICD-10-CM

## 2019-05-21 DIAGNOSIS — F4323 Adjustment disorder with mixed anxiety and depressed mood: Secondary | ICD-10-CM

## 2019-05-21 DIAGNOSIS — M1612 Unilateral primary osteoarthritis, left hip: Secondary | ICD-10-CM

## 2019-05-21 DIAGNOSIS — Z791 Long term (current) use of non-steroidal anti-inflammatories (NSAID): Secondary | ICD-10-CM

## 2019-05-21 DIAGNOSIS — R1013 Epigastric pain: Secondary | ICD-10-CM

## 2019-05-21 DIAGNOSIS — R739 Hyperglycemia, unspecified: Secondary | ICD-10-CM

## 2019-05-21 MED ORDER — ALBUTEROL SULFATE HFA 108 (90 BASE) MCG/ACT IN AERS: 2.0000 | INHALATION_SPRAY | Freq: Four times a day (QID) | RESPIRATORY_TRACT | 4 refills | Status: DC | PRN

## 2019-05-21 MED ORDER — MELOXICAM 7.5 MG PO TABS: 7.5000 mg | ORAL_TABLET | Freq: Every day | ORAL | 1 refills | Status: DC

## 2019-05-21 MED ORDER — AMLODIPINE BESYLATE 5 MG PO TABS: 5.0000 mg | ORAL_TABLET | Freq: Every day | ORAL | 3 refills | Status: DC

## 2019-05-21 MED ORDER — TIZANIDINE HCL 4 MG PO TABS: 4.0000 mg | ORAL_TABLET | Freq: Every day | ORAL | 0 refills | Status: DC

## 2019-05-21 MED ORDER — FAMOTIDINE 20 MG PO TABS: 20.0000 mg | ORAL_TABLET | Freq: Two times a day (BID) | ORAL | 4 refills | Status: DC

## 2019-05-21 MED FILL — ALBUTEROL SULFATE HFA 108 (: 108 (90 BAS | 25 days supply | Qty: 18 | Fill #0

## 2019-05-21 MED FILL — MELOXICAM 7.5 MG TABLET: 7.5 | 30 days supply | Qty: 30 | Fill #0

## 2019-05-21 MED FILL — tiZANidine HCL 4 MG TABS: 4 | 30 days supply | Qty: 30 | Fill #0

## 2019-05-21 MED FILL — FAMOTIDINE 20 MG TABS: 20 | 30 days supply | Qty: 60 | Fill #0

## 2019-05-21 NOTE — Progress Notes (Signed)
Pt. Is here for hip pain. Pt. Barely can get around due to her hip pain.   Pt. Is requesting a prescription to have a walker.

## 2019-05-21 NOTE — Patient Instructions (Signed)

## 2019-05-21 NOTE — Progress Notes (Signed)
Established Patient Office Visit  Subjective:  Patient ID: Breanna Sullivan, female    DOB: [birth year]  Age: 59 y.o. MRN: [id number]  CC:  Chief Complaint  Patient presents with  . Hip Pain    HPI Breanna Sullivan, 59 year old female who was last seen in the office on 01/08/2018, who presents secondary to complaint of ongoing chronic left hip pain.  She is also status post emergency department visit in October 2020 for cellulitis of the right thigh but she reports that this has now resolved after treatment.  She also reports that she needs a refill of Mobic that she currently takes for pain in her left hip.  She reports that the Mobic slightly decreases the pain but the pain stays between a 6-8 on a 0-to-10 scale.  She has pain at rest which increases when she moves her hip such as trying to get out of bed or standing up or walking.  Pain is sharp.  She has been told that she needs a hip replacement but she cannot afford to have this done.  She has been trying to save up money to have the procedure done.  She does have some occasional mild stomach discomfort and burping/belching.  Stomach discomfort is in the mid upper abdomen.  She was on Zantac in the past which helped.  She also needs refill of albuterol inhaler for her COPD.          She has had some headaches and dizziness and she was not sure if this was related to stress.  She has never been on blood pressure medicine in the past but states that her blood pressure was elevated during her emergency department visit in October.  She does feel as if she is having a lot of stress and anxiety related to her chronic pain and financial difficulty with the current COVID-19 pandemic and her inability to work.  She is in the process of filing for disability.  She also reports that she needs a prescription for a wheeled walker with a seat so that she can walk without falling and she also has to rest at times due to her COPD as well as due to her chronic pain.  She  is unable to walk around the grocery store or go shopping without assistance or being able to hold onto something while she walks.          She denies any past diagnosis of gestational diabetes.  He has been told that some of her blood sugars have been elevated on prior labs.  She denies any increased thirst or urinary frequency.     Past Medical History:  Diagnosis Date  . Asthma with bronchitis   . Cellulitis of right lower leg    "I&D in ER on 11/12/2014; admitted 11/15/2014"  . Cervical cancer ([place])    "they froze it"  . COPD (chronic obstructive pulmonary disease) ([place])   . History of hiatal hernia   . Melanoma of back ([place])   . TIA (transient ischemic attack) ~ 2011   "mild", denies residual on 11/15/2014    Past Surgical History:  Procedure Laterality Date  . GYNECOLOGIC CRYOSURGERY     "for cervical cancer"  . MELANOMA EXCISION  2011   "back"  . TUBAL LIGATION  1990    Social History   Socioeconomic History  . Marital status: Divorced    Spouse name: Not on file  . Number of children: Not on file  . Years of  education: Not on file  . Highest education level: Not on file  Occupational History  . Not on file  Tobacco Use  . Smoking status: Current Every Day Smoker    Packs/day: 0.50    Years: 34.00    Pack years: 17.00    Types: Cigarettes  . Smokeless tobacco: Never Used  Substance and Sexual Activity  . Alcohol use: No  . Drug use: No  . Sexual activity: Not on file  Other Topics Concern  . Not on file  Social History Narrative  . Not on file   Social Determinants of Health   Financial Resource Strain:   . Difficulty of Paying Living Expenses: Not on file  Food Insecurity:   . Worried About Charity fundraiser in the Last Year: Not on file  . Ran Out of Food in the Last Year: Not on file  Transportation Needs:   . Lack of Transportation (Medical): Not on file  . Lack of Transportation (Non-Medical): Not on file  Physical Activity:   . Days of  Exercise per Week: Not on file  . Minutes of Exercise per Session: Not on file  Stress:   . Feeling of Stress : Not on file  Social Connections:   . Frequency of Communication with Friends and Family: Not on file  . Frequency of Social Gatherings with Friends and Family: Not on file  . Attends Religious Services: Not on file  . Active Member of Clubs or Organizations: Not on file  . Attends Archivist Meetings: Not on file  . Marital Status: Not on file  Intimate Partner Violence:   . Fear of Current or Ex-Partner: Not on file  . Emotionally Abused: Not on file  . Physically Abused: Not on file  . Sexually Abused: Not on file    Outpatient Medications Prior to Visit  Medication Sig Dispense Refill  . Acetaminophen (TYLENOL PO) Take by mouth.    [name] albuterol (PROVENTIL HFA;VENTOLIN HFA) 108 (90 BASE) MCG/ACT inhaler Inhale 2 puffs into the lungs every 6 (six) hours as needed for wheezing or shortness of breath.    . ranitidine (ZANTAC) 150 MG tablet Take 1 tablet (150 mg total) by mouth 2 (two) times daily. 60 tablet 6  . Aspirin-Acetaminophen-Caffeine (GOODY HEADACHE PO) Take 1 packet by mouth daily as needed (pain).    [name] HYDROcodone-acetaminophen (NORCO) 5-325 MG tablet Take 1 tablet by mouth every 6 (six) hours as needed for severe pain. (Patient not taking: Reported on 05/21/2019) 12 tablet 0   No facility-administered medications prior to visit.    Allergies  Allergen Reactions  . Asa Buff (Mag [Buffered Aspirin] Nausea Only  . Chocolate Nausea And Vomiting  . Compazine Hives    ROS Review of Systems  Constitutional: Positive for fatigue. Negative for chills and fever.  HENT: Negative for sore throat and trouble swallowing.   Respiratory: Positive for shortness of breath (with activity or when she has a cold). Negative for cough.   Cardiovascular: Negative for chest pain and palpitations.  Gastrointestinal: Positive for abdominal pain (Occasional mid, upper  abdominal discomfort). Negative for blood in stool, constipation, diarrhea and nausea.  Endocrine: Negative for polydipsia, polyphagia and polyuria.  Genitourinary: Negative for dysuria and frequency.  Musculoskeletal: Positive for arthralgias, back pain and gait problem.  Neurological: Negative for dizziness and headaches.  Hematological: Negative for adenopathy. Does not bruise/bleed easily.  Psychiatric/Behavioral: Positive for sleep disturbance (Difficult to find comfortable sleep position due to hip  pain). Negative for self-injury and suicidal ideas. The patient is nervous/anxious.       Objective:    Physical Exam  Constitutional: She is oriented to person, place, and time. She appears well-developed and well-nourished.  Well-nourished well-developed overweight/obese older female in no acute distress who is sitting on chair in exam room.  Patient has a wheeled walker nearby which she reports she borrowed from a neighbor so that she could get to today's visit.  She is wearing mask as per office COVID-19 protocol  Neck: No JVD present. No thyromegaly present.  Cardiovascular: Normal rate and regular rhythm.  Pulmonary/Chest: Effort normal and breath sounds normal. No respiratory distress. She has no wheezes.  Abdominal: Soft. There is abdominal tenderness (Mild epigastric discomfort to palpation, no rebound or guarding). There is no rebound and no guarding.  Musculoskeletal:        General: Tenderness present.     Cervical back: Normal range of motion and neck supple.     Comments: Patient with moderate discomfort with light palpation to the area of the greater trochanter of the left hip therefore formal range of motion was not done due to patient's level of pain  Lymphadenopathy:    She has no cervical adenopathy.  Neurological: She is alert and oriented to person, place, and time.  Skin: Skin is warm and dry.  Psychiatric: Her behavior is normal.  Patient with flattened affect and  become slightly tearful at times when discussing her chronic pain issues and inability to afford surgery  Nursing note and vitals reviewed.   BP (!) 180/85 (BP Location: Left Arm, Patient Position: Sitting, Cuff Size: Normal)   Pulse 90   Temp 99.2 F (37.3 C) (Oral)   Ht 5\' 3"  (1.6 m)   Wt 200 lb (90.7 kg)   SpO2 95%   BMI 35.43 kg/m  Wt Readings from Last 3 Encounters:  05/21/19 200 lb (90.7 kg)  05/25/18 150 lb (68 kg)  01/08/18 160 lb 12.8 oz (72.9 kg)     Health Maintenance Due  Topic Date Due  . Hepatitis C Screening  [birth year]  . PAP SMEAR-Modifier  12/16/1983  . MAMMOGRAM  12/15/2012  . COLONOSCOPY  12/15/2012  . INFLUENZA VACCINE  11/29/2018    She was asked to schedule follow-up for preventative health issues.  No results found for: TSH Lab Results  Component Value Date   WBC 4.0 06/17/2015   HGB 16.6 (H) 06/17/2015   HCT 49.4 (H) 06/17/2015   MCV 94.6 06/17/2015   PLT 176 06/17/2015   Lab Results  Component Value Date   NA 140 06/17/2015   K 3.6 06/17/2015   CO2 21 (L) 06/17/2015   GLUCOSE 110 (H) 06/17/2015   BUN 17 06/17/2015   CREATININE 1.29 (H) 06/17/2015   BILITOT 0.5 06/17/2015   ALKPHOS 64 06/17/2015   AST 36 06/17/2015   ALT 36 06/17/2015   PROT 7.2 06/17/2015   ALBUMIN 4.0 06/17/2015   CALCIUM 8.5 (L) 06/17/2015   ANIONGAP 11 06/17/2015   No results found for: CHOL No results found for: HDL No results found for: LDLCALC No results found for: TRIG No results found for: [facility] Lab Results  Component Value Date   HGBA1C 5.5 11/17/2014      Assessment & Plan:  1. Primary osteoarthritis of left hip; 2. Chronic left hip pain X-ray was done of the left femur on 05/25/2018 showing moderate arthritis of the left hip with joint space narrowing and acetabular cyst.  She reports that she has previously been told that she needs a hip replacement which she cannot afford at this time.  She is encouraged to apply for the [facility]  financial assistance program and will also have medical social worker contact the patient for additional resources such as vocational rehabilitation but at patient's age and with her comorbid conditions including COPD, patient may not be able to return to meaningful work.  Refill provided of meloxicam as she is reminded to eat prior to taking the medication to help avoid stomach upset and she has also made aware that this medication can increase the risk of GI bleed and she should report any blood in the stool or black stools.  Additionally medication can increase risk for nephropathy and cardiovascular incidents.  She is also provided with prescription to obtain her own 4 wheeled rolling walker with seat.  Prescription for patient to try tizanidine to help with initiation of sleep and relaxation of the hip joint/muscle spasm. - meloxicam (MOBIC) 7.5 MG tablet; Take 1 tablet (7.5 mg total) by mouth daily. As needed for pain; take after eating  Dispense: 30 tablet; Refill: 1 - For home use only DME 4 wheeled rolling walker with seat [id number]) - Ambulatory referral to Social Work - tiZANidine (ZANAFLEX) 4 MG tablet; Take 1 tablet (4 mg total) by mouth at bedtime. As needed for muscle spasm  Dispense: 30 tablet; Refill: 0  3. Elevated blood sugar She has had elevated blood sugar on prior blood work but unfortunately due to lack of insurance/medical coverage she has not had any recent blood work.  She will have comprehensive metabolic panel in follow-up of her long-term use of medications for pain including nonsteroidal anti-inflammatories/meloxicam and OTC Tylenol arthritis as well as in follow-up of renal function and prior elevated glucose.  Hemoglobin A1c will also be done at today's visit to see if patient might be prediabetic or diabetic though she denies any current symptoms - Comprehensive metabolic panel - Hemoglobin A1C  4. Essential hypertension On review of chart, patient with blood pressure of  152/96 during her emergency department visit on 02/05/2019 and at today's visit, blood pressure is elevated at 180/85.  She agrees to start medication to help lower her blood pressure.  Prescription provided for amlodipine 5 mg once daily and patient will follow-up in the next 4 to 6 weeks for blood pressure recheck.  She is encouraged to call sooner if she has any difficulty with the medication or continued headaches or dizziness or any other concerns - amLODipine (NORVASC) 5 MG tablet; Take 1 tablet (5 mg total) by mouth daily. To lower blood pressure  Dispense: 30 tablet; Refill: 3  5. Epigastric pain Patient with epigastric discomfort on examination.  She reports that she was previously on Zantac in the past which helped.  Zantac has currently been recalled therefore patient will be given prescription for Pepcid 20 mg twice daily.  She is to make sure that she eats prior to taking meloxicam and also avoid any other medications that are known to trigger her epigastric discomfort as well as avoidance of eating within 2 hours of bedtime.  Call or return sooner if symptoms are not improving otherwise will follow up in 4 to 6 weeks - Comprehensive metabolic panel - famotidine (PEPCID) 20 MG tablet; Take 1 tablet (20 mg total) by mouth 2 (two) times daily.  Dispense: 60 tablet; Refill: 4  6. Chronic obstructive pulmonary disease, unspecified COPD type ([place]) Refill provided of  albuterol inhaler to use as needed for shortness of breath/wheeze or cough associated with COPD - albuterol (VENTOLIN HFA) 108 (90 Base) MCG/ACT inhaler; Inhale 2 puffs into the lungs every 6 (six) hours as needed for wheezing or shortness of breath.  Dispense: 18 g; Refill: 4  7. Encounter for long-term use of non-steroidal anti-inflammatory medication We will recheck electrolytes/renal function as part of comprehensive metabolic panel due to patient's long-term use of nonsteroidal anti-inflammatory medication/meloxicam  8. Need  for follow-up by social worker Patient with anxiety and reactive depression regarding chronic left hip pain and inability to avoid need to surgery to help with relief of pain.  Patient will be referred to social worker for further evaluation and counseling as well as to provide any other resources which may be available - Ambulatory referral to Social Work  An After Visit Summary was printed and given to the patient.  Follow-up: Return in about 6 weeks (around 07/02/2019) for HTN, hip pain.    Antony Blackbird, MD

## 2019-05-22 ENCOUNTER — Telehealth (INDEPENDENT_AMBULATORY_CARE_PROVIDER_SITE_OTHER): Payer: Self-pay

## 2019-05-22 LAB — COMPREHENSIVE METABOLIC PANEL WITH GFR
ALT: 17 IU/L (ref 0–32)
AST: 20 IU/L (ref 0–40)
Albumin/Globulin Ratio: 2 (ref 1.2–2.2)
Albumin: 4.7 g/dL (ref 3.8–4.9)
Alkaline Phosphatase: 116 IU/L (ref 39–117)
BUN/Creatinine Ratio: 21 (ref 9–23)
BUN: 23 mg/dL (ref 6–24)
Bilirubin Total: 0.4 mg/dL (ref 0.0–1.2)
CO2: 20 mmol/L (ref 20–29)
Calcium: 9.9 mg/dL (ref 8.7–10.2)
Chloride: 104 mmol/L (ref 96–106)
Creatinine, Ser: 1.09 mg/dL — ABNORMAL HIGH (ref 0.57–1.00)
GFR calc Af Amer: 66 mL/min/1.73
GFR calc non Af Amer: 57 mL/min/1.73 — ABNORMAL LOW
Globulin, Total: 2.3 g/dL (ref 1.5–4.5)
Glucose: 89 mg/dL (ref 65–99)
Potassium: 4.3 mmol/L (ref 3.5–5.2)
Sodium: 141 mmol/L (ref 134–144)
Total Protein: 7 g/dL (ref 6.0–8.5)

## 2019-05-22 LAB — HEMOGLOBIN A1C
Est. average glucose Bld gHb Est-mCnc: 103 mg/dL
Hgb A1c MFr Bld: 5.2 % (ref 4.8–5.6)

## 2019-05-22 MED FILL — AMLODIPINE BESYLATE 5 MG TA: 5 | 30 days supply | Qty: 30 | Fill #0

## 2019-05-22 NOTE — Telephone Encounter (Signed)
Patient verified date of birth. She is aware that of Normal Hgb A1c, indicating that blood sugars on average have been normal over the past 90 days. Mild increase in creatinine at 1.09 but this is improved from labs. Electrolytes and liver enzymes are otherwise normal. She verbalized understanding of results. Nat Christen, CMA

## 2019-05-22 NOTE — Progress Notes (Signed)
Integrated Behavioral Health Initial Visit  MRN: [id number] Name: Breanna Sullivan  Number of [place] Clinician visits:: 1/6 Session Start time: 4:13pm  Session End time: 4:42pm Total time: 30  Type of Service: [place] Interpretor:No. Interpretor Name and Language: n/a   SUBJECTIVE: Breanna Sullivan is a 59 y.o. female accompanied by self Patient was referred by Dr. Chapman Fitch for symptoms of depression and anxiety as evidenced by positive PHQ-9 and GAD-7 scores. Patient reports the following symptoms/concerns: Pt reports severe pain and immobility due to hip pain. Duration of problem: one year; Severity of problem: severe  OBJECTIVE: Mood: Depressed and Affect: Tearful Risk of harm to self or others: No plan to harm self or others  LIFE CONTEXT: Family and Social: Pt rents room in a home. Pt has a son who assists her in grocery shopping and paying her rent.  School/Work: Pt is unemployed. Pt is uninsured. Pt applied for Medicaid but was denied. Self-Care: Pt receives social support from friends. Pt plays games on her phone. Pt receives food stamps. Life Changes: Pt states she is in need of a hip replacement. Pt is unable to procure surgery due to uninsured status.  STRENGTHS: Patient receives assistance from son  Patient has social support Patient resides in safe housing  GOALS ADDRESSED: Patient will: 1. Reduce symptoms of: anxiety, depression and stress 2. Increase knowledge and/or ability of: coping skills and self-management skills  3. Demonstrate ability to: Increase adequate support systems for patient/family  INTERVENTIONS: Interventions utilized: Supportive Counseling, Psychoeducation and/or Health Education and Link to [employer]  Standardized Assessments completed: GAD-7 and PHQ 2&9  ASSESSMENT: Patient currently experiencing symptoms of depression and anxiety triggered by hip pain and psychosocial stressors. MSW  intern explained the correlation between physical and emotional health and provided community resources to assist patient in securing medical financial assistance and community resources.    Patient may benefit from applying for [facility] financial assistance program. MSW Intern provided patient with application and provided guidance on paperwork needed and application process. MSW Intern encouraged pt to apply for [employer] to assist her with transportation in the interim.    PLAN: 1. Follow up with behavioral health clinician on : MSW Intern encouraged patient to contact this MSW intern or Christa See, LCSW if additional support is needed. 2. Behavioral recommendations: Apply for [employer] , [employer], and utilize coping skills discussed in session. 3. Referral(s): Community Resources:  Haematologist, Financial trader 4. "From scale of 1-10, how likely are you to follow plan?":  Berniece Salines  MSW Intern  12:53 pm 05/22/2019

## 2019-05-22 NOTE — Telephone Encounter (Signed)
-----   Message from Antony Blackbird, MD sent at 05/22/2019  1:03 PM EST ----- Normal Hgb A1c, indicating that blood sugars on average have been normal over the past 90 days.  Mild increase in creatinine at 1.09 but this is improved from labs.  Electrolytes and liver enzymes are otherwise normal

## 2019-05-27 ENCOUNTER — Telehealth: Payer: Self-pay | Admitting: Licensed Clinical Social Worker

## 2019-05-27 NOTE — Telephone Encounter (Addendum)
This MSW intern contacted patient regarding pt's request for a prescription for her walker. Pt stated she would like the prescription mailed to her confirmed home address. PCP was informed of the request.  Pt reports she continues to experience pain and inquired about hydrocodone-acetaminophen prescription listed on her AVS medications. This MSW intern did not see a prescription for the medication ordered on 05/21/19 during her most recent encounter with PCP Dr. Chapman Fitch.This MSW Intern explained the medication was likely listed on her AVS from her med history. Patient verbalized understanding. No other concerns noted.

## 2019-06-23 ENCOUNTER — Encounter: Payer: Self-pay | Admitting: Family Medicine

## 2019-07-02 ENCOUNTER — Ambulatory Visit: Payer: Self-pay | Admitting: Family Medicine

## 2019-07-07 IMAGING — CR DG FEMUR 2+V*L*
4 series · 4 of 4 positions shown · non-contrast
Comparison: None.

CLINICAL DATA: Left leg pain

EXAM:
LEFT FEMUR 2 VIEWS

[t femur with hip  ap left]
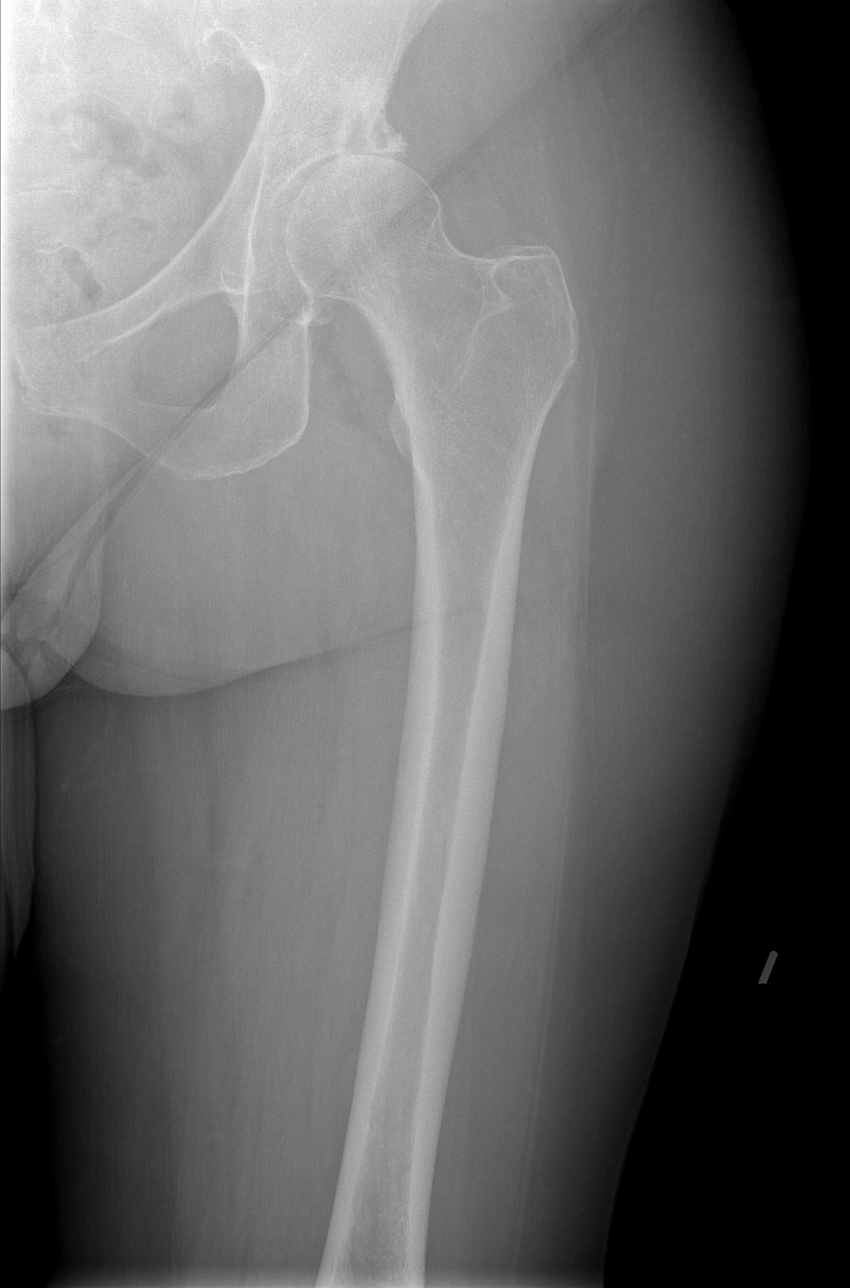

[t femur with knee ap left]
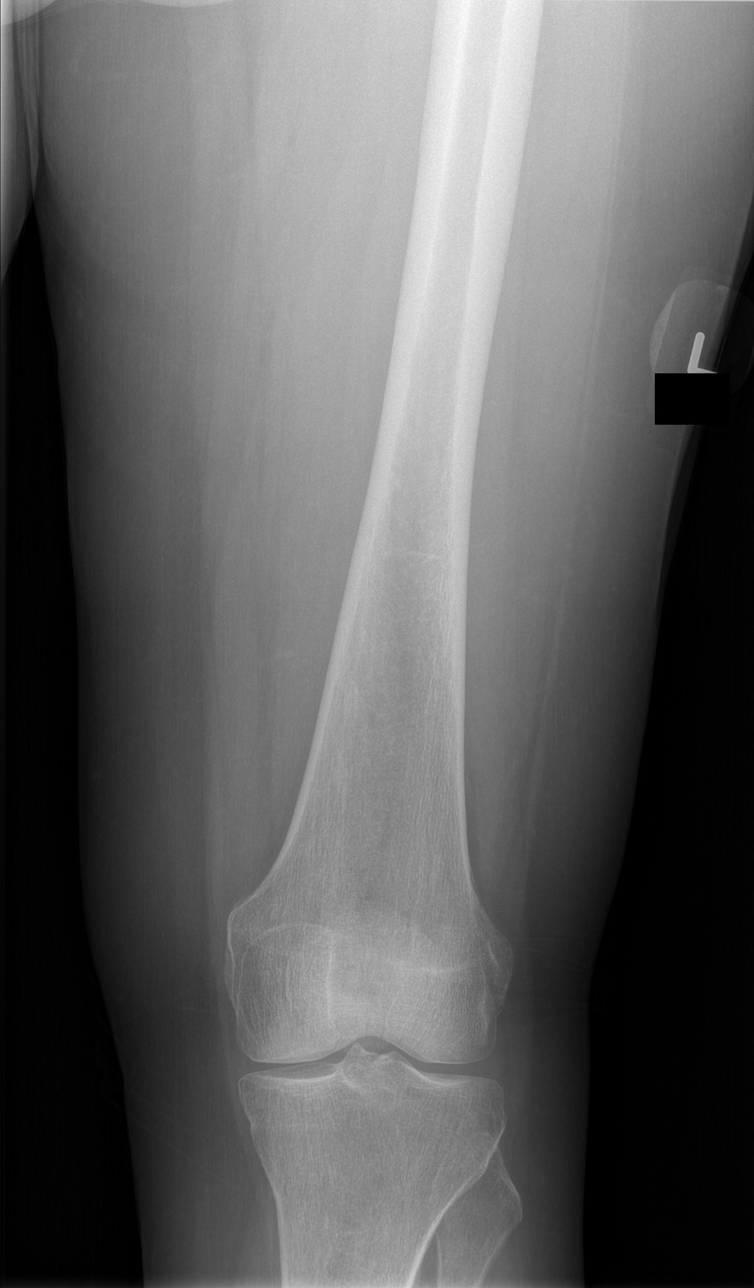

[t femur with hip lat left]
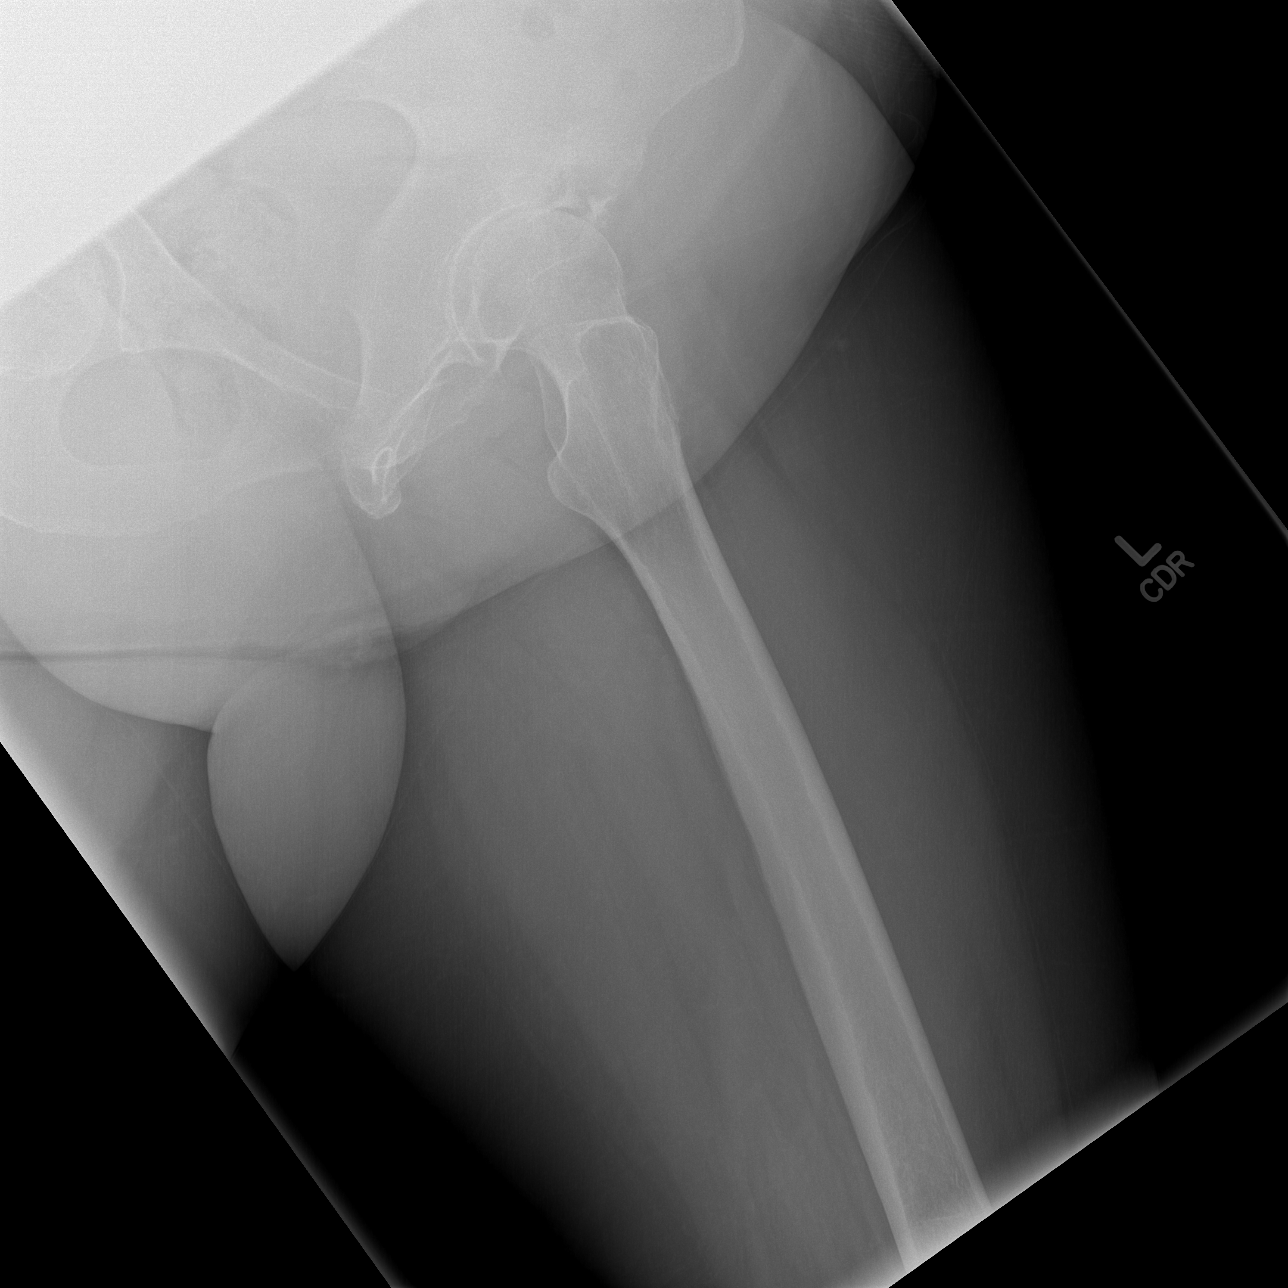

[t femur with knee lat left]
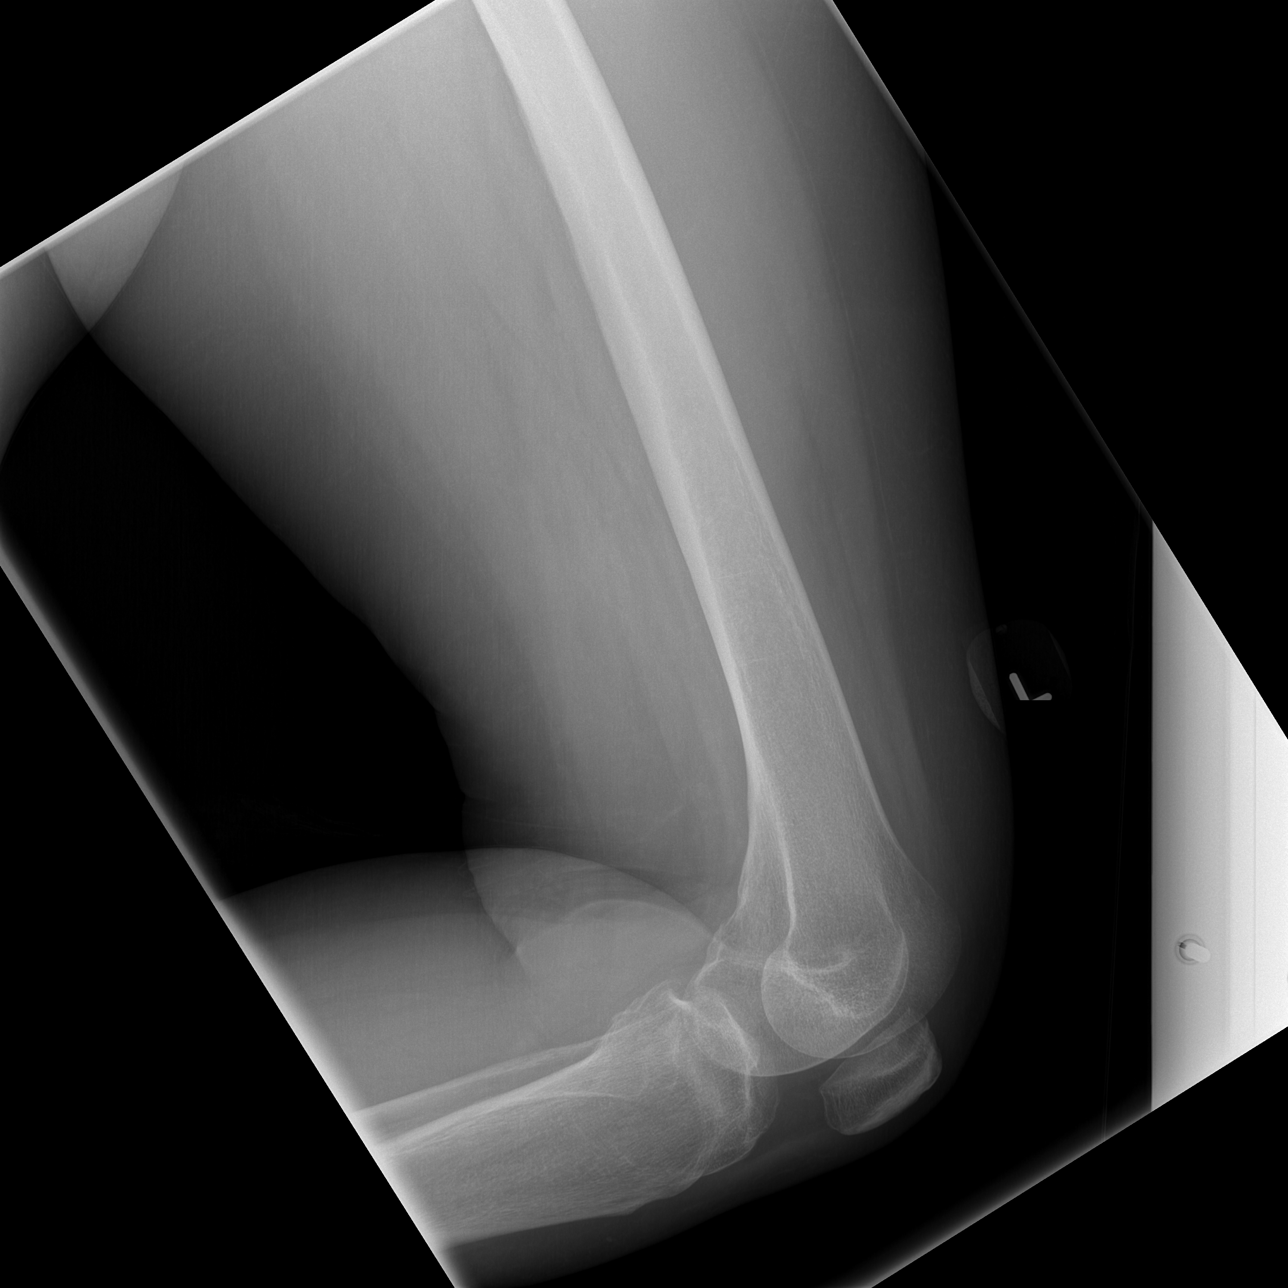

[4 of 4 positions shown; findings below may reference images not displayed]

FINDINGS: No fracture or malalignment. Moderate arthritis of the left hip with
joint space narrowing and acetabular cyst.
IMPRESSION: Moderate arthritis of the left hip.  No acute osseous abnormality.

## 2019-07-24 ENCOUNTER — Encounter: Payer: Self-pay | Admitting: Family Medicine

## 2019-07-24 ENCOUNTER — Ambulatory Visit: Payer: Self-pay | Attending: Family Medicine | Admitting: Family Medicine

## 2019-07-24 ENCOUNTER — Other Ambulatory Visit: Payer: Self-pay

## 2019-07-24 VITALS — BP 130/79 | HR 98 | Temp 97.6°F | Resp 18

## 2019-07-24 DIAGNOSIS — F4323 Adjustment disorder with mixed anxiety and depressed mood: Secondary | ICD-10-CM

## 2019-07-24 DIAGNOSIS — M25552 Pain in left hip: Secondary | ICD-10-CM

## 2019-07-24 DIAGNOSIS — G8929 Other chronic pain: Secondary | ICD-10-CM

## 2019-07-24 DIAGNOSIS — M1612 Unilateral primary osteoarthritis, left hip: Secondary | ICD-10-CM

## 2019-07-24 DIAGNOSIS — G479 Sleep disorder, unspecified: Secondary | ICD-10-CM

## 2019-07-24 DIAGNOSIS — J441 Chronic obstructive pulmonary disease with (acute) exacerbation: Secondary | ICD-10-CM

## 2019-07-24 MED ORDER — DOXYCYCLINE HYCLATE 100 MG PO TABS: 100.0000 mg | ORAL_TABLET | Freq: Two times a day (BID) | ORAL | 0 refills | Status: DC

## 2019-07-24 MED ORDER — DULOXETINE HCL 30 MG PO CPEP: 30.0000 mg | ORAL_CAPSULE | Freq: Two times a day (BID) | ORAL | 3 refills | Status: DC

## 2019-07-24 MED ORDER — TRAZODONE HCL 50 MG PO TABS: ORAL_TABLET | ORAL | 1 refills | Status: DC

## 2019-07-24 MED ORDER — PREDNISONE 20 MG PO TABS: 40.0000 mg | ORAL_TABLET | Freq: Every day | ORAL | 0 refills | Status: AC

## 2019-07-24 MED ORDER — MELOXICAM 7.5 MG PO TABS: 7.5000 mg | ORAL_TABLET | Freq: Every day | ORAL | 1 refills | Status: DC

## 2019-07-24 MED FILL — ?PREDINSONE 10MG TABLETS: 10 | 5 days supply | Qty: 20 | Fill #0

## 2019-07-24 MED FILL — ?DOXYCYCLINE HYCLATE 100MG: 100 | 10 days supply | Qty: 20 | Fill #0

## 2019-07-24 MED FILL — MELOXICAM 7.5 MG TABLET: 7.5 | 30 days supply | Qty: 30 | Fill #0

## 2019-07-24 MED FILL — ?TRAZODONE HCL 50 TABS: 50 | 30 days supply | Qty: 15 | Fill #0

## 2019-07-24 MED FILL — ?DULoxetine HCL 30MG CPEP: 30 | 30 days supply | Qty: 60 | Fill #0

## 2019-07-24 NOTE — Patient Instructions (Signed)
You were given new medication-prednisone and doxycycline for your COPD exacerbation. New medication, trazodone to take 1/2 pill at bedtime to help with sleep and duloxetine 30 mg twice per day to help with pain as well as anxiety and depression.

## 2019-07-24 NOTE — Progress Notes (Signed)
Established Patient Office Visit  Subjective:  Patient ID: Breanna Sullivan, female    DOB: Dec 23, 1960  Age: 59 y.o. MRN: [id number]  CC: No chief complaint on file.   HPI Breanna Sullivan, 59 year old female with history of moderate osteoarthritis of the left hip with chronic pain and gait impairment who is seen in follow-up as she had elevated blood pressure at her last visit and was started on blood pressure medication however she reports that the medication caused her to have itching and she is now stopped the use of blood pressure medication.  She denies headaches or dizziness related to her blood pressure.  She has had some headaches which she feels are due to continued issues with increased stress due to continued left hip pain, decreased ability for self-care and ambulation due to her chronic hip pain as well as financial stressors as she is unable to work due to her hip pain.  Hip pain remains greater than a 10 on a 0-to-10 scale and is sharp.  She also feels as if the outside of her left hip/thigh area is numb however she still has tremendous pain in this area.  She reports that she is currently out of Mobic but this did help when she was taking it.  She reports that she does not have money to obtain her medications.        She did talk with the social worker and wanted to see if the social worker was available today as patient with continued anxiety and depression.  She denies tearfulness but states that she feels stressed and anxious.  She also reports that she has difficulty sleeping which is primarily due to her chronic pain as it is difficult for her to find a comfortable position and she also reports that she often either sleeps in a recliner or on the couch for ease of getting up.  She wakes up repeatedly throughout the night secondary to pain.          She also has history of COPD and she reports that she has had some increase in sensation of chest tightness, as well as increased cough which is  productive of a brownish sputum.  She denies any fever or chills.  She does feel fatigued.  She denies chest pain or palpitations.  She does have some shortness of breath with exertion.  Past Medical History:  Diagnosis Date  . Asthma with bronchitis   . Cellulitis of right lower leg    "I&D in ER on 11/12/2014; admitted 11/15/2014"  . Cervical cancer ([facility])    "they froze it"  . COPD (chronic obstructive pulmonary disease) ([place])   . History of hiatal hernia   . Melanoma of back ([facility])   . TIA (transient ischemic attack) ~ 2011   "mild", denies residual on 11/15/2014    Past Surgical History:  Procedure Laterality Date  . GYNECOLOGIC CRYOSURGERY     "for cervical cancer"  . MELANOMA EXCISION  2011   "back"  . TUBAL LIGATION  1990    History reviewed. No pertinent family history.  Social History   Socioeconomic History  . Marital status: Divorced    Spouse name: Not on file  . Number of children: Not on file  . Years of education: Not on file  . Highest education level: Not on file  Occupational History  . Not on file  Tobacco Use  . Smoking status: Current Every Day Smoker    Packs/day: 0.50  Years: 34.00    Pack years: 17.00    Types: Cigarettes  . Smokeless tobacco: Never Used  Substance and Sexual Activity  . Alcohol use: No  . Drug use: No  . Sexual activity: Not on file  Other Topics Concern  . Not on file  Social History Narrative  . Not on file   Social Determinants of Health   Financial Resource Strain:   . Difficulty of Paying Living Expenses:   Food Insecurity:   . Worried About Charity fundraiser in the Last Year:   . Arboriculturist in the Last Year:   Transportation Needs:   . Film/video editor (Medical):   [name] Lack of Transportation (Non-Medical):   Physical Activity:   . Days of Exercise per Week:   . Minutes of Exercise per Session:   Stress:   . Feeling of Stress :   Social Connections:   . Frequency of Communication with Friends  and Family:   . Frequency of Social Gatherings with Friends and Family:   . Attends Religious Services:   . Active Member of Clubs or Organizations:   . Attends Archivist Meetings:   [name] Marital Status:   Intimate Partner Violence:   . Fear of Current or Ex-Partner:   . Emotionally Abused:   [name] Physically Abused:   . Sexually Abused:     Outpatient Medications Prior to Visit  Medication Sig Dispense Refill  . Acetaminophen (TYLENOL PO) Take by mouth.    [name] albuterol (VENTOLIN HFA) 108 (90 Base) MCG/ACT inhaler Inhale 2 puffs into the lungs every 6 (six) hours as needed for wheezing or shortness of breath. 18 g 4  . Aspirin-Acetaminophen-Caffeine (GOODY HEADACHE PO) Take 1 packet by mouth daily as needed (pain).    . famotidine (PEPCID) 20 MG tablet Take 1 tablet (20 mg total) by mouth 2 (two) times daily. 60 tablet 4  . HYDROcodone-acetaminophen (NORCO) 5-325 MG tablet Take 1 tablet by mouth every 6 (six) hours as needed for severe pain. 12 tablet 0  . tiZANidine (ZANAFLEX) 4 MG tablet Take 1 tablet (4 mg total) by mouth at bedtime. As needed for muscle spasm 30 tablet 0  . amLODipine (NORVASC) 5 MG tablet Take 1 tablet (5 mg total) by mouth daily. To lower blood pressure (Patient not taking: Reported on 07/24/2019) 30 tablet 3  . meloxicam (MOBIC) 7.5 MG tablet Take 1 tablet (7.5 mg total) by mouth daily. As needed for pain; take after eating (Patient not taking: Reported on 07/24/2019) 30 tablet 1   No facility-administered medications prior to visit.    Allergies  Allergen Reactions  . Asa Buff (Mag [Buffered Aspirin] Nausea Only  . Chocolate Nausea And Vomiting  . Compazine Hives    ROS Review of Systems  Constitutional: Positive for fatigue. Negative for chills and fever.  HENT: Negative for sore throat and trouble swallowing.   Eyes: Negative for photophobia and visual disturbance.  Respiratory: Positive for cough and shortness of breath.   Cardiovascular: Negative  for chest pain and palpitations.  Gastrointestinal: Negative for abdominal pain, blood in stool, constipation, diarrhea and nausea.  Endocrine: Negative for polydipsia, polyphagia and polyuria.  Genitourinary: Negative for dysuria and frequency.  Musculoskeletal: Positive for arthralgias, back pain, gait problem and myalgias.  Skin: Negative for rash and wound.  Neurological: Positive for numbness and headaches. Negative for dizziness.  Hematological: Negative for adenopathy. Does not bruise/bleed easily.  Psychiatric/Behavioral: Positive for sleep disturbance. Negative  for self-injury and suicidal ideas. The patient is nervous/anxious.       Objective:    Physical Exam  Constitutional: She is oriented to person, place, and time. She appears well-developed and well-nourished.  Well-nourished well-developed overweight/obese older female sitting on Rollator.  Patient was initially very anxious but became calmer after discussing her medical issues and concerns  Cardiovascular: Normal rate and regular rhythm.  Pulmonary/Chest: Effort normal. No respiratory distress.  Rhonchorous breath sounds in all lung fields  Abdominal: Soft. There is no abdominal tenderness. There is no rebound and no guarding.  Musculoskeletal:        General: Tenderness present. No edema.     Comments: Patient is sitting on a seated walker bearing most of the weight on the right hip.  Patient with tenderness to mild palpation over the external left hip  Neurological: She is alert and oriented to person, place, and time.  Skin: Skin is warm and dry.  Psychiatric: Her behavior is normal. Thought content normal.  Patient was initially very anxious  Nursing note and vitals reviewed.   BP 130/79 (BP Location: Left Arm)   Pulse 98   Temp 97.6 F (36.4 C)   Resp 18   SpO2 97%  Wt Readings from Last 3 Encounters:  05/21/19 200 lb (90.7 kg)  05/25/18 150 lb (68 kg)  01/08/18 160 lb 12.8 oz (72.9 kg)     Health  Maintenance Due  Topic Date Due  . Hepatitis C Screening  Never done  . PAP SMEAR-Modifier  Never done  . MAMMOGRAM  Never done  . COLONOSCOPY  Never done  . INFLUENZA VACCINE  Never done      No results found for: TSH Lab Results  Component Value Date   WBC 4.0 06/17/2015   HGB 16.6 (H) 06/17/2015   HCT 49.4 (H) 06/17/2015   MCV 94.6 06/17/2015   PLT 176 06/17/2015   Lab Results  Component Value Date   NA 141 05/21/2019   K 4.3 05/21/2019   CO2 20 05/21/2019   GLUCOSE 89 05/21/2019   BUN 23 05/21/2019   CREATININE 1.09 (H) 05/21/2019   BILITOT 0.4 05/21/2019   ALKPHOS 116 05/21/2019   AST 20 05/21/2019   ALT 17 05/21/2019   PROT 7.0 05/21/2019   ALBUMIN 4.7 05/21/2019   CALCIUM 9.9 05/21/2019   ANIONGAP 11 06/17/2015   No results found for: CHOL No results found for: HDL No results found for: LDLCALC No results found for: TRIG No results found for: [facility] Lab Results  Component Value Date   HGBA1C 5.2 05/21/2019      Assessment & Plan:  1. Chronic left hip pain; 2.  Primary osteoarthritis of left hip She has had imaging showing moderate arthritis of the left hip with joint space narrowing and acetabular cyst which was done 05/25/2018.  Prescription refill provided for meloxicam and after discussion, she also agreed to try the use of duloxetine which can help with both pain and depression as patient additionally has lumbar radiculopathy.  Patient is to apply for the [facility] financial discount so that hopefully she will be eligible for additional assistance with the cost of medications and specialty follow-up.  Pharmacy was able to help patient sign up for medications [facility] at today's visit as she reported being unable to afford her medications. - DULoxetine (CYMBALTA) 30 MG capsule; Take 1 capsule (30 mg total) by mouth 2 (two) times daily.  Dispense: 60 capsule; Refill: 3 -  meloxicam (MOBIC) 7.5 MG tablet; Take 1 tablet (7.5 mg total) by mouth  daily. As needed for pain; take after eating  Dispense: 30 tablet; Refill: 1  3. Adjustment disorder with mixed anxiety and depressed mood Patient with anxiety and depression due to issues with chronic pain as well as financial stressors.  Social work was not available to meet with the patient at today's visit but referral placed so that patient can be contacted for further follow-up.  After discussion, patient agrees to start the use of duloxetine which can help with both pain as well as depression.  Prescription provided for duloxetine 30 mg twice daily and patient will follow-up in 2 to 3 weeks.  She should call or return sooner if she has any issues with the medication.  If she has any suicidal thoughts or ideation she is aware that she should go to the emergency department for further evaluation.  Patient also had paperwork with her today's visit and wanted to know if this was all of the information that she needed to bring to apply for the [facility] financial discount and the financial counselor was able to meet with the patient today and reviewed the documents so that patient can be set up for appointment. - DULoxetine (CYMBALTA) 30 MG capsule; Take 1 capsule (30 mg total) by mouth 2 (two) times daily.  Dispense: 60 capsule; Refill: 3 - Ambulatory referral to Social Work  4. COPD with acute exacerbation [facility]) Patient with COPD and at today's visit she has rhonchorous breath sounds and she reports recent increase in productive cough.  She will be placed on doxycycline 100 mg twice daily x10 days and prednisone x5 days for treatment of COPD exacerbation.  She is aware that she should follow-up at the emergency department if she has any acute worsening of her breathing or any concerns.  She was also made aware that she needs to eat prior to taking the prednisone to help avoid stomach upset.  Continue use of albuterol inhaler prescribed at last visit. - doxycycline (VIBRA-TABS) 100 MG tablet; Take 1 tablet  (100 mg total) by mouth 2 (two) times daily. For bronchitis/COPD  Dispense: 20 tablet; Refill: 0 - predniSONE (DELTASONE) 20 MG tablet; Take 2 tablets (40 mg total) by mouth daily with breakfast for 5 days.  Dispense: 10 tablet; Refill: 0  5. Difficulty sleeping She reports difficulty sleeping which is primarily due to pain.  She has been given medication to help with her pain but will also be given prescription to take 25 mg of trazodone at bedtime to help improve sleep. - traZODone (DESYREL) 50 MG tablet; 1/2 pill at bedtime as needed to help with sleep  Dispense: 15 tablet; Refill: 1   Meds ordered this encounter  Medications  . DULoxetine (CYMBALTA) 30 MG capsule    Sig: Take 1 capsule (30 mg total) by mouth 2 (two) times daily.    Dispense:  60 capsule    Refill:  3  . meloxicam (MOBIC) 7.5 MG tablet    Sig: Take 1 tablet (7.5 mg total) by mouth daily. As needed for pain; take after eating    Dispense:  30 tablet    Refill:  1    Counsel patient not to take other NSAID's with mobic  . doxycycline (VIBRA-TABS) 100 MG tablet    Sig: Take 1 tablet (100 mg total) by mouth 2 (two) times daily. For bronchitis/COPD    Dispense:  20 tablet    Refill:  0  .  predniSONE (DELTASONE) 20 MG tablet    Sig: Take 2 tablets (40 mg total) by mouth daily with breakfast for 5 days.    Dispense:  10 tablet    Refill:  0  . traZODone (DESYREL) 50 MG tablet    Sig: 1/2 pill at bedtime as needed to help with sleep    Dispense:  15 tablet    Refill:  1    An After Visit Summary was printed and given to the patient.  Follow-up: Return in about 2 weeks (around 08/07/2019) for COPD/new medications; ED if breathing is worse.    Antony Blackbird, MD

## 2019-07-24 NOTE — Progress Notes (Signed)
Here for HTN check up  Did not take her BP stated it makes her itch  Made MD aware of PHQ9 and Gad 7  Would like to speak with [facility]

## 2019-08-06 ENCOUNTER — Ambulatory Visit: Payer: Self-pay | Admitting: Licensed Clinical Social Worker

## 2019-08-06 ENCOUNTER — Ambulatory Visit: Payer: Self-pay | Attending: Family Medicine | Admitting: Physician Assistant

## 2019-08-06 ENCOUNTER — Other Ambulatory Visit: Payer: Self-pay

## 2019-08-06 DIAGNOSIS — Z91199 Patient's noncompliance with other medical treatment and regimen due to unspecified reason: Secondary | ICD-10-CM

## 2019-08-06 DIAGNOSIS — Z5329 Procedure and treatment not carried out because of patient's decision for other reasons: Secondary | ICD-10-CM

## 2019-08-06 NOTE — Progress Notes (Deleted)
1. Chronic left hip pain; 2.  Primary osteoarthritis of left hip She has had imaging showing moderate arthritis of the left hip with joint space narrowing and acetabular cyst which was done 05/25/2018.  Prescription refill provided for meloxicam and after discussion, she also agreed to try the use of duloxetine which can help with both pain and depression as patient additionally has lumbar radiculopathy.  Patient is to apply for the [facility] financial discount so that hopefully she will be eligible for additional assistance with the cost of medications and specialty follow-up.  Pharmacy was able to help patient sign up for medications through [facility] at today's visit as she reported being unable to afford her medications. - DULoxetine (CYMBALTA) 30 MG capsule; Take 1 capsule (30 mg total) by mouth 2 (two) times daily.  Dispense: 60 capsule; Refill: 3 - meloxicam (MOBIC) 7.5 MG tablet; Take 1 tablet (7.5 mg total) by mouth daily. As needed for pain; take after eating  Dispense: 30 tablet; Refill: 1  3. Adjustment disorder with mixed anxiety and depressed mood Patient with anxiety and depression due to issues with chronic pain as well as financial stressors.  Social work was not available to meet with the patient at today's visit but referral placed so that patient can be contacted for further follow-up.  After discussion, patient agrees to start the use of duloxetine which can help with both pain as well as depression.  Prescription provided for duloxetine 30 mg twice daily and patient will follow-up in 2 to 3 weeks.  She should call or return sooner if she has any issues with the medication.  If she has any suicidal thoughts or ideation she is aware that she should go to the emergency department for further evaluation.  Patient also had paperwork with her today's visit and wanted to know if this was all of the information that she needed to bring to apply for the [facility] financial discount and the  financial counselor was able to meet with the patient today and reviewed the documents so that patient can be set up for appointment. - DULoxetine (CYMBALTA) 30 MG capsule; Take 1 capsule (30 mg total) by mouth 2 (two) times daily.  Dispense: 60 capsule; Refill: 3 - Ambulatory referral to Social Work  4. COPD with acute exacerbation [facility]) Patient with COPD and at today's visit she has rhonchorous breath sounds and she reports recent increase in productive cough.  She will be placed on doxycycline 100 mg twice daily x10 days and prednisone x5 days for treatment of COPD exacerbation.  She is aware that she should follow-up at the emergency department if she has any acute worsening of her breathing or any concerns.  She was also made aware that she needs to eat prior to taking the prednisone to help avoid stomach upset.  Continue use of albuterol inhaler prescribed at last visit. - doxycycline (VIBRA-TABS) 100 MG tablet; Take 1 tablet (100 mg total) by mouth 2 (two) times daily. For bronchitis/COPD  Dispense: 20 tablet; Refill: 0 - predniSONE (DELTASONE) 20 MG tablet; Take 2 tablets (40 mg total) by mouth daily with breakfast for 5 days.  Dispense: 10 tablet; Refill: 0  5. Difficulty sleeping She reports difficulty sleeping which is primarily due to pain.  She has been given medication to help with her pain but will also be given prescription to take 25 mg of trazodone at bedtime to help improve sleep. - traZODone (DESYREL) 50 MG tablet; 1/2 pill at bedtime as  needed to help with sleep  Dispense: 15 tablet; Refill: 1

## 2019-08-10 ENCOUNTER — Ambulatory Visit: Payer: Self-pay | Admitting: Licensed Clinical Social Worker

## 2019-08-10 ENCOUNTER — Ambulatory Visit: Payer: Self-pay

## 2019-08-10 ENCOUNTER — Ambulatory Visit: Payer: Self-pay | Admitting: Family Medicine

## 2019-08-12 ENCOUNTER — Telehealth: Payer: Self-pay | Admitting: Licensed Clinical Social Worker

## 2019-08-12 ENCOUNTER — Ambulatory Visit: Payer: Self-pay | Attending: Family Medicine | Admitting: Licensed Clinical Social Worker

## 2019-08-12 ENCOUNTER — Other Ambulatory Visit: Payer: Self-pay

## 2019-08-12 NOTE — Telephone Encounter (Signed)
Call placed to patient regarding scheduled [facility] appointment. Voicemail was not set-up; therefore, LCSW was unable to leave a message for a return call.

## 2019-12-30 ENCOUNTER — Encounter: Payer: Self-pay | Admitting: Physician Assistant

## 2019-12-30 ENCOUNTER — Ambulatory Visit: Payer: Self-pay | Attending: Physician Assistant | Admitting: Physician Assistant

## 2019-12-30 DIAGNOSIS — G8929 Other chronic pain: Secondary | ICD-10-CM

## 2019-12-30 DIAGNOSIS — J449 Chronic obstructive pulmonary disease, unspecified: Secondary | ICD-10-CM

## 2019-12-30 DIAGNOSIS — R1013 Epigastric pain: Secondary | ICD-10-CM

## 2019-12-30 DIAGNOSIS — M1612 Unilateral primary osteoarthritis, left hip: Secondary | ICD-10-CM

## 2019-12-30 DIAGNOSIS — M25552 Pain in left hip: Secondary | ICD-10-CM

## 2019-12-30 DIAGNOSIS — G479 Sleep disorder, unspecified: Secondary | ICD-10-CM

## 2019-12-30 DIAGNOSIS — F4323 Adjustment disorder with mixed anxiety and depressed mood: Secondary | ICD-10-CM

## 2019-12-30 DIAGNOSIS — I10 Essential (primary) hypertension: Secondary | ICD-10-CM

## 2019-12-30 MED ORDER — ALBUTEROL SULFATE HFA 108 (90 BASE) MCG/ACT IN AERS: 2.0000 | INHALATION_SPRAY | Freq: Four times a day (QID) | RESPIRATORY_TRACT | 4 refills | Status: DC | PRN

## 2019-12-30 MED ORDER — FAMOTIDINE 20 MG PO TABS: 20.0000 mg | ORAL_TABLET | Freq: Two times a day (BID) | ORAL | 4 refills | Status: DC

## 2019-12-30 MED ORDER — DULOXETINE HCL 30 MG PO CPEP: 30.0000 mg | ORAL_CAPSULE | Freq: Two times a day (BID) | ORAL | 3 refills | Status: DC

## 2019-12-30 MED ORDER — MELOXICAM 7.5 MG PO TABS: 7.5000 mg | ORAL_TABLET | Freq: Every day | ORAL | 1 refills | Status: DC

## 2019-12-30 MED ORDER — TIZANIDINE HCL 4 MG PO TABS: 4.0000 mg | ORAL_TABLET | Freq: Every day | ORAL | 0 refills | Status: DC

## 2019-12-30 MED ORDER — TRAZODONE HCL 50 MG PO TABS: ORAL_TABLET | ORAL | 1 refills | Status: DC

## 2019-12-30 MED ORDER — AMLODIPINE BESYLATE 5 MG PO TABS: 5.0000 mg | ORAL_TABLET | Freq: Every day | ORAL | 3 refills | Status: DC

## 2019-12-30 MED FILL — DULoxetine HCL 30 MG CPEP: 30 | 30 days supply | Qty: 60 | Fill #1

## 2019-12-30 MED FILL — FAMOTIDINE 20 MG TABS: 20 | 30 days supply | Qty: 60 | Fill #1

## 2019-12-30 MED FILL — tiZANidine HCL 4 MG TABS: 4 | 30 days supply | Qty: 30 | Fill #0

## 2019-12-30 MED FILL — !VENTOLIN HFA INHALER: 108 (90 BAS | 25 days supply | Qty: 18 | Fill #1

## 2019-12-30 MED FILL — MELOXICAM 7.5 MG TABLET: 7.5 | 30 days supply | Qty: 30 | Fill #1

## 2019-12-30 MED FILL — AMLODIPINE BESYLATE 5 MG TA: 5 | 30 days supply | Qty: 30 | Fill #1

## 2019-12-30 MED FILL — ?TRAZODONE HCL 50 TABS: 50 | 30 days supply | Qty: 15 | Fill #1

## 2019-12-30 NOTE — Progress Notes (Signed)
Virtual Visit via Telephone Note  I connected with Breanna Sullivan on 12/30/19 at  2:10 PM EDT by telephone and verified that I am speaking with the correct person using two identifiers.   I discussed the limitations, risks, security and privacy concerns of performing an evaluation and management service by telephone and the availability of in person appointments. I also discussed with the patient that there may be a patient responsible charge related to this service. The patient expressed understanding and agreed to proceed.   PATIENT visit by telephone virtually in the context of Covid-19 pandemic. Patient location:  home My Location:  [place] office  Persons on the call: me and the patient  History of Present Illness:  Patient needs RF of all her meds.  Pain in L hip is worse and wants to see specialist.  Does not have a ride to the clinic.  Uses a walker for ambulation.  Out of all meds.      Observations/Objective:  NAD.  A&O X 3   Assessment and Plan: 1. Chronic obstructive pulmonary disease, unspecified COPD type (HCC) - albuterol (VENTOLIN HFA) 108 (90 Base) MCG/ACT inhaler; Inhale 2 puffs into the lungs every 6 (six) hours as needed for wheezing or shortness of breath.  Dispense: 18 g; Refill: 4  2. Essential hypertension - amLODipine (NORVASC) 5 MG tablet; Take 1 tablet (5 mg total) by mouth daily. To lower blood pressure  Dispense: 30 tablet; Refill: 3  3. Chronic left hip pain - DULoxetine (CYMBALTA) 30 MG capsule; Take 1 capsule (30 mg total) by mouth 2 (two) times daily.  Dispense: 60 capsule; Refill: 3 - meloxicam (MOBIC) 7.5 MG tablet; Take 1 tablet (7.5 mg total) by mouth daily. As needed for pain; take after eating  Dispense: 30 tablet; Refill: 1 - tiZANidine (ZANAFLEX) 4 MG tablet; Take 1 tablet (4 mg total) by mouth at bedtime. As needed for muscle spasm  Dispense: 30 tablet; Refill: 0 - Ambulatory referral to Orthopedic Surgery  4. Adjustment disorder with mixed anxiety  and depressed mood - DULoxetine (CYMBALTA) 30 MG capsule; Take 1 capsule (30 mg total) by mouth 2 (two) times daily.  Dispense: 60 capsule; Refill: 3  5. Primary osteoarthritis of left hip - meloxicam (MOBIC) 7.5 MG tablet; Take 1 tablet (7.5 mg total) by mouth daily. As needed for pain; take after eating  Dispense: 30 tablet; Refill: 1  6. Difficulty sleeping - traZODone (DESYREL) 50 MG tablet; 1/2 pill at bedtime as needed to help with sleep  Dispense: 15 tablet; Refill: 1  7. Epigastric pain - famotidine (PEPCID) 20 MG tablet; Take 1 tablet (20 mg total) by mouth 2 (two) times daily.  Dispense: 60 tablet; Refill: 4   Follow Up Instructions: See Dr Chapman Fitch 2-3 months   I discussed the assessment and treatment plan with the patient. The patient was provided an opportunity to ask questions and all were answered. The patient agreed with the plan and demonstrated an understanding of the instructions.   The patient was advised to call back or seek an in-person evaluation if the symptoms worsen or if the condition fails to improve as anticipated.  I provided 14 minutes of non-face-to-face time during this encounter.   Freeman Caldron, PA-C  Patient ID: Breanna Sullivan, female   DOB: June 13, 1960, 59 y.o.   MRN: [id number]

## 2020-02-29 ENCOUNTER — Other Ambulatory Visit: Payer: Self-pay | Admitting: Family Medicine

## 2020-02-29 DIAGNOSIS — M1612 Unilateral primary osteoarthritis, left hip: Secondary | ICD-10-CM

## 2020-02-29 DIAGNOSIS — G8929 Other chronic pain: Secondary | ICD-10-CM

## 2020-02-29 DIAGNOSIS — G479 Sleep disorder, unspecified: Secondary | ICD-10-CM

## 2020-02-29 MED ORDER — MELOXICAM 7.5 MG PO TABS: 7.5000 mg | ORAL_TABLET | Freq: Every day | ORAL | 0 refills | Status: DC

## 2020-02-29 MED ORDER — TRAZODONE HCL 50 MG PO TABS: ORAL_TABLET | ORAL | 1 refills | Status: DC

## 2020-02-29 NOTE — Telephone Encounter (Signed)
Requested medication (s) are due for refill today:yes  Requested medication (s) are on the active medication list:yes  Last refill: 12/30/19  #30  0 refills  Future visit scheduled Yes 04/01/20  Notes to clinic:not delegated.  Pt would like prescription mailed to her home address  Requested Prescriptions  Pending Prescriptions Disp Refills   tiZANidine (ZANAFLEX) 4 MG tablet 30 tablet 0    Sig: Take 1 tablet (4 mg total) by mouth at bedtime. As needed for muscle spasm      Not Delegated - Cardiovascular:  Alpha-2 Agonists - tizanidine Failed - 02/29/2020  5:29 PM      Failed - This refill cannot be delegated      Passed - Valid encounter within last 6 months    Recent Outpatient Visits           2 months ago Chronic obstructive pulmonary disease, unspecified COPD type [facility])   [place], [place], [place]   6 months ago No-show for appointment   [place] May[place], Cari S, [place]   7 months ago Chronic left hip pain   [place], [place]   9 months ago Adjustment disorder with mixed anxiety and depressed mood   [place], [place] D, LCSW   9 months ago Primary osteoarthritis of left hip   [place], [place], MD       Future Appointments             In 1 month Fulp, [place], MD [place]             Signed Prescriptions Disp Refills   meloxicam (MOBIC) 7.5 MG tablet 90 tablet 0    Sig: Take 1 tablet (7.5 mg total) by mouth daily. As needed for pain; take after eating      Analgesics:  COX2 Inhibitors Failed - 02/29/2020  5:29 PM      Failed - HGB in normal range and within 360 days    Hemoglobin  Date Value Ref Range Status  06/17/2015 16.6 (H) 12.0 - 15.0 g/dL Final          Failed - Cr in normal range and within 360 days    Creatinine, Ser  Date Value Ref  Range Status  05/21/2019 1.09 (H) 0.57 - 1.00 mg/dL Final          Passed - Patient is not pregnant      Passed - Valid encounter within last 12 months    Recent Outpatient Visits           2 months ago Chronic obstructive pulmonary disease, unspecified COPD type [facility])   [place], [place], [place]   6 months ago No-show for appointment   [place] Mayers, Cari S, [place]   7 months ago Chronic left hip pain   [place], MD   9 months ago Adjustment disorder with mixed anxiety and depressed mood   [place], [place] D, LCSW   9 months ago Primary osteoarthritis of left hip   [place], MD       Future Appointments             In 1 month Fulp, [place],  MD [place]              traZODone (DESYREL) 50 MG tablet 15 tablet 1    Sig: 1/2 pill at bedtime as needed to help with sleep      Psychiatry: Antidepressants - Serotonin Modulator Passed - 02/29/2020  5:29 PM      Passed - Valid encounter within last 6 months    Recent Outpatient Visits           2 months ago Chronic obstructive pulmonary disease, unspecified COPD type [facility])   [place], [place], [place]   6 months ago No-show for appointment   [place], [place]   7 months ago Chronic left hip pain   [place], MD   9 months ago Adjustment disorder with mixed anxiety and depressed mood   [place], [place] D, LCSW   9 months ago Primary osteoarthritis of left hip   [place], MD       Future Appointments             In 1 month Fulp, Ander [place], MD [place]

## 2020-02-29 NOTE — Telephone Encounter (Signed)
meloxicam (MOBIC) 7.5 MG tablet Medication Date: 12/30/2019 Department: [facility] Ordering/Authorizing: Argentina Donovan, PA-C   tiZANidine (ZANAFLEX) 4 MG tablet Medication Date: 12/30/2019 Department: [facility] Ordering/Authorizing: Argentina Donovan, PA-C   traZODone (DESYREL) 50 MG tablet Medication Date: 12/30/2019 Department: [facility] Ordering/Authorizing: Argentina Donovan, [place], [place] [employer] Phone:  [phone]  Fax:  [phone]     Wants scripts mailed

## 2020-02-29 NOTE — Telephone Encounter (Signed)
Pt would like prescriptions mailed to her home address please.

## 2020-03-01 MED ORDER — TIZANIDINE HCL 4 MG PO TABS: 4.0000 mg | ORAL_TABLET | Freq: Every day | ORAL | 0 refills | Status: DC

## 2020-03-01 MED FILL — tiZANidine HCL 4 MG TABS: 4 | 30 days supply | Qty: 30 | Fill #0

## 2020-03-01 MED FILL — MELOXICAM 7.5 MG TABLET: 7.5 | 30 days supply | Qty: 30 | Fill #0

## 2020-03-01 MED FILL — ?TRAZODONE HCL 50 TABS: 50 | 30 days supply | Qty: 15 | Fill #0

## 2020-03-10 MED FILL — PROAIR HFA 90 MCG INHALER: 108 (90 BAS | 25 days supply | Qty: 9 | Fill #0

## 2020-03-10 MED FILL — tiZANidine HCL 4 MG TABS: 4 | 30 days supply | Qty: 30 | Fill #0

## 2020-03-10 MED FILL — traZODone HCL 50 MG TABS: 50 | 30 days supply | Qty: 15 | Fill #0

## 2020-03-10 MED FILL — MELOXICAM 7.5 MG TABLET: 7.5 | 30 days supply | Qty: 30 | Fill #0

## 2020-03-17 ENCOUNTER — Telehealth: Payer: Self-pay | Admitting: Family Medicine

## 2020-03-17 DIAGNOSIS — J449 Chronic obstructive pulmonary disease, unspecified: Secondary | ICD-10-CM

## 2020-03-17 NOTE — Telephone Encounter (Signed)
Medication: albuterol (VENTOLIN HFA) 108 (90 Base) MCG/ACT inhaler [[id number]] ,   Has the patient contacted their pharmacy? YES  (Agent: If no, request that the patient contact the pharmacy for the refill.) (Agent: If yes, when and what did the pharmacy advise?)  Preferred Pharmacy (with phone number or street name): [facility], [place] [address]  [place], [place] [address]  Phone:  [phone] Fax:  [phone]   Agent: Please be advised that RX refills may take up to 3 business days. We ask that you follow-up with your pharmacy.

## 2020-03-18 MED FILL — PROAIR HFA 90 MCG INHALER: 108 (90 BAS | 25 days supply | Qty: 9 | Fill #0

## 2020-03-18 NOTE — Telephone Encounter (Signed)
Patient had refills available in our pharmacy. They are working on this.

## 2020-04-01 ENCOUNTER — Ambulatory Visit: Payer: Self-pay | Admitting: Family Medicine

## 2020-04-11 ENCOUNTER — Other Ambulatory Visit: Payer: Self-pay

## 2020-04-11 ENCOUNTER — Encounter: Payer: Self-pay | Admitting: Physician Assistant

## 2020-04-11 ENCOUNTER — Ambulatory Visit: Payer: Medicaid Other | Attending: Physician Assistant | Admitting: Physician Assistant

## 2020-04-11 DIAGNOSIS — G479 Sleep disorder, unspecified: Secondary | ICD-10-CM

## 2020-04-11 DIAGNOSIS — G8929 Other chronic pain: Secondary | ICD-10-CM

## 2020-04-11 DIAGNOSIS — I10 Essential (primary) hypertension: Secondary | ICD-10-CM | POA: Diagnosis not present

## 2020-04-11 DIAGNOSIS — F4323 Adjustment disorder with mixed anxiety and depressed mood: Secondary | ICD-10-CM

## 2020-04-11 DIAGNOSIS — M25552 Pain in left hip: Secondary | ICD-10-CM | POA: Diagnosis not present

## 2020-04-11 DIAGNOSIS — Z1231 Encounter for screening mammogram for malignant neoplasm of breast: Secondary | ICD-10-CM

## 2020-04-11 DIAGNOSIS — J449 Chronic obstructive pulmonary disease, unspecified: Secondary | ICD-10-CM

## 2020-04-11 DIAGNOSIS — M1612 Unilateral primary osteoarthritis, left hip: Secondary | ICD-10-CM

## 2020-04-11 DIAGNOSIS — Z1211 Encounter for screening for malignant neoplasm of colon: Secondary | ICD-10-CM

## 2020-04-11 DIAGNOSIS — R1013 Epigastric pain: Secondary | ICD-10-CM

## 2020-04-11 MED ORDER — AMLODIPINE BESYLATE 5 MG PO TABS: 5.0000 mg | ORAL_TABLET | Freq: Every day | ORAL | 1 refills | Status: AC

## 2020-04-11 MED ORDER — MELOXICAM 7.5 MG PO TABS: 7.5000 mg | ORAL_TABLET | Freq: Every day | ORAL | 1 refills | Status: AC

## 2020-04-11 MED ORDER — FAMOTIDINE 20 MG PO TABS: 20.0000 mg | ORAL_TABLET | Freq: Two times a day (BID) | ORAL | 4 refills | Status: AC

## 2020-04-11 MED ORDER — ALBUTEROL SULFATE HFA 108 (90 BASE) MCG/ACT IN AERS: 2.0000 | INHALATION_SPRAY | Freq: Four times a day (QID) | RESPIRATORY_TRACT | 4 refills | Status: AC | PRN

## 2020-04-11 MED ORDER — TIZANIDINE HCL 4 MG PO TABS: 4.0000 mg | ORAL_TABLET | Freq: Every day | ORAL | 0 refills | Status: AC

## 2020-04-11 MED ORDER — DULOXETINE HCL 30 MG PO CPEP: 30.0000 mg | ORAL_CAPSULE | Freq: Two times a day (BID) | ORAL | 1 refills | Status: AC

## 2020-04-11 MED ORDER — TRAZODONE HCL 50 MG PO TABS: 50.0000 mg | ORAL_TABLET | Freq: Every evening | ORAL | 1 refills | Status: AC | PRN

## 2020-04-11 NOTE — Progress Notes (Signed)
Patient verified DOB Patient has eaten today and patient has not taken medication today. Patient complains of Left side hip pain scaled at a 8. Patient complains this pain being present for 2 years. Patient in process of hip replacement. Patient request refill on all medications.

## 2020-04-11 NOTE — Patient Instructions (Signed)
Patient declines my chart

## 2020-04-11 NOTE — Progress Notes (Signed)
Established Patient Office Visit  Subjective:  Patient ID: Breanna Sullivan, female    DOB: 08-02-1960  Age: 59 y.o. MRN: [id number]  CC:  Chief Complaint  Patient presents with  . Medication Refill   Virtual Visit via Telephone Note  I connected with Breanna Sullivan on 04/11/20 at 10:50 AM EST by telephone and verified that I am speaking with the correct person using two identifiers.  Location: Patient: Home Provider: [facility]   I discussed the limitations, risks, security and privacy concerns of performing an evaluation and management service by telephone and the availability of in person appointments. I also discussed with the patient that there may be a patient responsible charge related to this service. The patient expressed understanding and agreed to proceed.   History of Present Illness:  Reports that she needs a refill on all of her medications.  Reports that she recently received Medicaid and will be able to follow-up with orthopedic surgery to be evaluated for hip replacement.  Has not been checking blood pressure at home, states that she is compliant to amlodipine.  Denies any hypertensive or hypotensive symptoms  Reports that she has been using 50 mg of trazodone for insomnia with relief  No other concerns at this time   Observations/Objective: Medical history and current medications reviewed, no physical exam completed    Past Medical History:  Diagnosis Date  . Asthma with bronchitis   . Cellulitis of right lower leg    "I&D in ER on 11/12/2014; admitted 11/15/2014"  . Cervical cancer ([place])    "they froze it"  . COPD (chronic obstructive pulmonary disease) ([place])   . History of hiatal hernia   . Melanoma of back ([place])   . TIA (transient ischemic attack) ~ 2011   "mild", denies residual on 11/15/2014    Past Surgical History:  Procedure Laterality Date  . GYNECOLOGIC CRYOSURGERY     "for cervical cancer"  . MELANOMA EXCISION  2011   "back"  . TUBAL LIGATION   1990    History reviewed. No pertinent family history.  Social History   Socioeconomic History  . Marital status: Divorced    Spouse name: Not on file  . Number of children: Not on file  . Years of education: Not on file  . Highest education level: Not on file  Occupational History  . Not on file  Tobacco Use  . Smoking status: Current Every Day Smoker    Packs/day: 0.25    Years: 34.00    Pack years: 8.50    Types: Cigarettes  . Smokeless tobacco: Never Used  Vaping Use  . Vaping Use: Never used  Substance and Sexual Activity  . Alcohol use: No  . Drug use: No  . Sexual activity: Not Currently    Birth control/protection: Surgical  Other Topics Concern  . Not on file  Social History Narrative  . Not on file   Social Determinants of Health   Financial Resource Strain: Not on file  Food Insecurity: Not on file  Transportation Needs: Not on file  Physical Activity: Not on file  Stress: Not on file  Social Connections: Not on file  Intimate Partner Violence: Not on file    Outpatient Medications Prior to Visit  Medication Sig Dispense Refill  . Acetaminophen (TYLENOL PO) Take by mouth.    [name] albuterol (VENTOLIN HFA) 108 (90 Base) MCG/ACT inhaler Inhale 2 puffs into the lungs every 6 (six) hours as needed for wheezing or shortness of breath.  18 g 4  . amLODipine (NORVASC) 5 MG tablet Take 1 tablet (5 mg total) by mouth daily. To lower blood pressure 30 tablet 3  . Aspirin-Acetaminophen-Caffeine (GOODY HEADACHE PO) Take 1 packet by mouth daily as needed (pain).    . DULoxetine (CYMBALTA) 30 MG capsule Take 1 capsule (30 mg total) by mouth 2 (two) times daily. 60 capsule 3  . famotidine (PEPCID) 20 MG tablet Take 1 tablet (20 mg total) by mouth 2 (two) times daily. 60 tablet 4  . meloxicam (MOBIC) 7.5 MG tablet Take 1 tablet (7.5 mg total) by mouth daily. As needed for pain; take after eating 90 tablet 0  . tiZANidine (ZANAFLEX) 4 MG tablet Take 1 tablet (4 mg total) by  mouth at bedtime. As needed for muscle spasm 30 tablet 0  . traZODone (DESYREL) 50 MG tablet 1/2 pill at bedtime as needed to help with sleep 15 tablet 1  . HYDROcodone-acetaminophen (NORCO) 5-325 MG tablet Take 1 tablet by mouth every 6 (six) hours as needed for severe pain. 12 tablet 0   No facility-administered medications prior to visit.    Allergies  Allergen Reactions  . Asa Buff (Mag [Buffered Aspirin] Nausea Only  . Chocolate Nausea And Vomiting  . Compazine Hives    ROS Review of Systems  Constitutional: Negative.   HENT: Negative.   Eyes: Negative.   Respiratory: Negative for cough and shortness of breath.   Cardiovascular: Negative for chest pain and palpitations.  Endocrine: Negative.   Genitourinary: Negative.   Musculoskeletal: Positive for arthralgias.  Skin: Negative.   Allergic/Immunologic: Negative.   Neurological: Negative.   Hematological: Negative.   Psychiatric/Behavioral: Negative for sleep disturbance.      Objective:     There were no vitals taken for this visit. Wt Readings from Last 3 Encounters:  05/21/19 200 lb (90.7 kg)  05/25/18 150 lb (68 kg)  01/08/18 160 lb 12.8 oz (72.9 kg)     Health Maintenance Due  Topic Date Due  . Hepatitis C Screening  Never done  . COVID-19 Vaccine (1) Never done  . PAP SMEAR-Modifier  Never done  . MAMMOGRAM  Never done  . COLONOSCOPY  Never done  . INFLUENZA VACCINE  Never done    There are no preventive care reminders to display for this patient.  No results found for: TSH Lab Results  Component Value Date   WBC 4.0 06/17/2015   HGB 16.6 (H) 06/17/2015   HCT 49.4 (H) 06/17/2015   MCV 94.6 06/17/2015   PLT 176 06/17/2015   Lab Results  Component Value Date   NA 141 05/21/2019   K 4.3 05/21/2019   CO2 20 05/21/2019   GLUCOSE 89 05/21/2019   BUN 23 05/21/2019   CREATININE 1.09 (H) 05/21/2019   BILITOT 0.4 05/21/2019   ALKPHOS 116 05/21/2019   AST 20 05/21/2019   ALT 17 05/21/2019    PROT 7.0 05/21/2019   ALBUMIN 4.7 05/21/2019   CALCIUM 9.9 05/21/2019   ANIONGAP 11 06/17/2015   No results found for: CHOL No results found for: HDL No results found for: LDLCALC No results found for: TRIG No results found for: CHOLHDL Lab Results  Component Value Date   HGBA1C 5.2 05/21/2019      Assessment & Plan:   Problem List Items Addressed This Visit   None    Assessment and Plan: 1. Chronic obstructive pulmonary disease, unspecified COPD type ([place]) Patient overdue for in person physical exam and fasting labs.  Patient to be given appointment on a Friday to help her with transportation issues.  Continue current regimen - albuterol (VENTOLIN HFA) 108 (90 Base) MCG/ACT inhaler; Inhale 2 puffs into the lungs every 6 (six) hours as needed for wheezing or shortness of breath.  Dispense: 18 g; Refill: 4  2. Essential hypertension Patient encouraged to check blood pressure on a daily basis, keep a written log and bring to next office visit.  Continue current regimen - amLODipine (NORVASC) 5 MG tablet; Take 1 tablet (5 mg total) by mouth daily. To lower blood pressure  Dispense: 30 tablet; Refill: 1  3. Chronic left hip pain Restart referral to orthopedic surgery, continue current regimen - Ambulatory referral to Orthopedic Surgery - DULoxetine (CYMBALTA) 30 MG capsule; Take 1 capsule (30 mg total) by mouth 2 (two) times daily.  Dispense: 60 capsule; Refill: 1 - meloxicam (MOBIC) 7.5 MG tablet; Take 1 tablet (7.5 mg total) by mouth daily. As needed for pain; take after eating  Dispense: 30 tablet; Refill: 1 - tiZANidine (ZANAFLEX) 4 MG tablet; Take 1 tablet (4 mg total) by mouth at bedtime. As needed for muscle spasm  Dispense: 30 tablet; Refill: 0  4. Adjustment disorder with mixed anxiety and depressed mood Continue current regimen - DULoxetine (CYMBALTA) 30 MG capsule; Take 1 capsule (30 mg total) by mouth 2 (two) times daily.  Dispense: 60 capsule; Refill: 1  5.  Primary osteoarthritis of left hip  - meloxicam (MOBIC) 7.5 MG tablet; Take 1 tablet (7.5 mg total) by mouth daily. As needed for pain; take after eating  Dispense: 30 tablet; Refill: 1  6. Difficulty sleeping Continue trazodone 50 mg - traZODone (DESYREL) 50 MG tablet; Take 1 tablet (50 mg total) by mouth at bedtime as needed for sleep.  Dispense: 30 tablet; Refill: 1  7. Screening mammogram for breast cancer  - MM DIGITAL SCREENING BILATERAL; Future  8. Screening for colon cancer  - Ambulatory referral to Gastroenterology   Follow Up Instructions:    I discussed the assessment and treatment plan with the patient. The patient was provided an opportunity to ask questions and all were answered. The patient agreed with the plan and demonstrated an understanding of the instructions.   The patient was advised to call back or seek an in-person evaluation if the symptoms worsen or if the condition fails to improve as anticipated.  I provided 21 minutes of non-face-to-face time during this encounter.   Keontre Defino S Mayers, PA-C    No orders of the defined types were placed in this encounter.   Follow-up: No follow-ups on file.    Loraine Grip Mayers, PA-C

## 2020-04-13 DIAGNOSIS — J449 Chronic obstructive pulmonary disease, unspecified: Secondary | ICD-10-CM | POA: Insufficient documentation

## 2020-04-13 DIAGNOSIS — G479 Sleep disorder, unspecified: Secondary | ICD-10-CM | POA: Insufficient documentation

## 2020-04-13 DIAGNOSIS — M1612 Unilateral primary osteoarthritis, left hip: Secondary | ICD-10-CM | POA: Insufficient documentation

## 2020-04-13 DIAGNOSIS — G8929 Other chronic pain: Secondary | ICD-10-CM | POA: Insufficient documentation

## 2020-04-13 DIAGNOSIS — F4323 Adjustment disorder with mixed anxiety and depressed mood: Secondary | ICD-10-CM | POA: Insufficient documentation

## 2020-04-13 DIAGNOSIS — M25552 Pain in left hip: Secondary | ICD-10-CM | POA: Insufficient documentation

## 2020-04-13 DIAGNOSIS — I10 Essential (primary) hypertension: Secondary | ICD-10-CM | POA: Insufficient documentation

## 2020-04-15 ENCOUNTER — Ambulatory Visit: Payer: Self-pay | Admitting: Family
